# Patient Record
Sex: Female | Born: 1970 | Race: Black or African American | Hispanic: No | Smoking: Former smoker
Health system: Southern US, Community
[De-identification: ages and names within clinical notes are randomized; demographics above are authoritative.]

## PROBLEM LIST (undated history)

## (undated) ENCOUNTER — Inpatient Hospital Stay (HOSPITAL_COMMUNITY): Payer: Self-pay

## (undated) DIAGNOSIS — F329 Major depressive disorder, single episode, unspecified: Secondary | ICD-10-CM

## (undated) DIAGNOSIS — F32A Depression, unspecified: Secondary | ICD-10-CM

## (undated) DIAGNOSIS — F419 Anxiety disorder, unspecified: Secondary | ICD-10-CM

## (undated) DIAGNOSIS — J45909 Unspecified asthma, uncomplicated: Secondary | ICD-10-CM

## (undated) DIAGNOSIS — IMO0002 Reserved for concepts with insufficient information to code with codable children: Secondary | ICD-10-CM

## (undated) DIAGNOSIS — A599 Trichomoniasis, unspecified: Secondary | ICD-10-CM

## (undated) DIAGNOSIS — A549 Gonococcal infection, unspecified: Secondary | ICD-10-CM

## (undated) DIAGNOSIS — R51 Headache: Secondary | ICD-10-CM

## (undated) DIAGNOSIS — K8020 Calculus of gallbladder without cholecystitis without obstruction: Secondary | ICD-10-CM

## (undated) DIAGNOSIS — R87619 Unspecified abnormal cytological findings in specimens from cervix uteri: Secondary | ICD-10-CM

## (undated) HISTORY — PX: GALLBLADDER SURGERY: SHX652

## (undated) HISTORY — PX: GYNECOLOGIC CRYOSURGERY: SHX857

## (undated) HISTORY — DX: Anxiety disorder, unspecified: F41.9

## (undated) HISTORY — PX: EYE SURGERY: SHX253

---
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
## 1998-07-10 ENCOUNTER — Emergency Department (HOSPITAL_COMMUNITY): Admission: EM | Admit: 1998-07-10 | Discharge: 1998-07-10 | Payer: Self-pay | Admitting: Family Medicine

## 1998-07-12 ENCOUNTER — Emergency Department (HOSPITAL_COMMUNITY): Admission: EM | Admit: 1998-07-12 | Discharge: 1998-07-12 | Payer: Self-pay | Admitting: Internal Medicine

## 1999-10-13 ENCOUNTER — Emergency Department (HOSPITAL_COMMUNITY): Admission: EM | Admit: 1999-10-13 | Discharge: 1999-10-13 | Payer: Self-pay | Admitting: Emergency Medicine

## 2000-07-27 ENCOUNTER — Emergency Department (HOSPITAL_COMMUNITY): Admission: EM | Admit: 2000-07-27 | Discharge: 2000-07-27 | Payer: Self-pay | Admitting: Internal Medicine

## 2001-08-02 ENCOUNTER — Other Ambulatory Visit: Admission: RE | Admit: 2001-08-02 | Discharge: 2001-08-02 | Payer: Self-pay | Admitting: Family Medicine

## 2002-10-22 ENCOUNTER — Emergency Department (HOSPITAL_COMMUNITY): Admission: EM | Admit: 2002-10-22 | Discharge: 2002-10-23 | Payer: Self-pay | Admitting: Emergency Medicine

## 2003-05-14 ENCOUNTER — Emergency Department (HOSPITAL_COMMUNITY): Admission: EM | Admit: 2003-05-14 | Discharge: 2003-05-14 | Payer: Self-pay | Admitting: Emergency Medicine

## 2003-06-09 ENCOUNTER — Emergency Department (HOSPITAL_COMMUNITY): Admission: EM | Admit: 2003-06-09 | Discharge: 2003-06-09 | Payer: Self-pay | Admitting: Emergency Medicine

## 2003-09-01 ENCOUNTER — Emergency Department (HOSPITAL_COMMUNITY): Admission: EM | Admit: 2003-09-01 | Discharge: 2003-09-01 | Payer: Self-pay

## 2004-02-12 ENCOUNTER — Ambulatory Visit (HOSPITAL_COMMUNITY): Admission: RE | Admit: 2004-02-12 | Discharge: 2004-02-12 | Payer: Self-pay | Admitting: Family Medicine

## 2004-11-29 ENCOUNTER — Emergency Department (HOSPITAL_COMMUNITY): Admission: EM | Admit: 2004-11-29 | Discharge: 2004-11-29 | Payer: Self-pay | Admitting: Emergency Medicine

## 2005-04-04 ENCOUNTER — Emergency Department (HOSPITAL_COMMUNITY): Admission: EM | Admit: 2005-04-04 | Discharge: 2005-04-04 | Payer: Self-pay | Admitting: Emergency Medicine

## 2005-06-20 ENCOUNTER — Emergency Department (HOSPITAL_COMMUNITY): Admission: EM | Admit: 2005-06-20 | Discharge: 2005-06-20 | Payer: Self-pay | Admitting: Emergency Medicine

## 2005-11-20 ENCOUNTER — Emergency Department (HOSPITAL_COMMUNITY): Admission: EM | Admit: 2005-11-20 | Discharge: 2005-11-20 | Payer: Self-pay | Admitting: Emergency Medicine

## 2006-01-16 ENCOUNTER — Emergency Department (HOSPITAL_COMMUNITY): Admission: EM | Admit: 2006-01-16 | Discharge: 2006-01-16 | Payer: Self-pay | Admitting: Emergency Medicine

## 2007-02-13 ENCOUNTER — Emergency Department (HOSPITAL_COMMUNITY): Admission: EM | Admit: 2007-02-13 | Discharge: 2007-02-14 | Payer: Self-pay | Admitting: Emergency Medicine

## 2007-08-20 ENCOUNTER — Emergency Department (HOSPITAL_COMMUNITY): Admission: EM | Admit: 2007-08-20 | Discharge: 2007-08-20 | Payer: Self-pay | Admitting: Emergency Medicine

## 2009-10-12 ENCOUNTER — Emergency Department (HOSPITAL_COMMUNITY): Admission: EM | Admit: 2009-10-12 | Discharge: 2009-10-12 | Payer: Self-pay | Admitting: Emergency Medicine

## 2010-01-21 ENCOUNTER — Emergency Department (HOSPITAL_COMMUNITY): Admission: EM | Admit: 2010-01-21 | Discharge: 2010-01-21 | Payer: Self-pay | Admitting: Family Medicine

## 2010-02-15 ENCOUNTER — Emergency Department (HOSPITAL_COMMUNITY): Admission: EM | Admit: 2010-02-15 | Discharge: 2010-02-15 | Payer: Self-pay | Admitting: Emergency Medicine

## 2010-05-03 ENCOUNTER — Emergency Department (HOSPITAL_COMMUNITY): Admission: EM | Admit: 2010-05-03 | Discharge: 2010-05-03 | Payer: Self-pay | Admitting: Emergency Medicine

## 2010-07-21 ENCOUNTER — Emergency Department (HOSPITAL_COMMUNITY): Admission: EM | Admit: 2010-07-21 | Discharge: 2010-07-21 | Payer: Self-pay | Admitting: Emergency Medicine

## 2010-12-16 ENCOUNTER — Emergency Department (HOSPITAL_COMMUNITY)
Admission: EM | Admit: 2010-12-16 | Discharge: 2010-12-16 | Disposition: A | Discharge status: Home / Self Care | Payer: Self-pay | Attending: Emergency Medicine | Admitting: Emergency Medicine

## 2010-12-16 DIAGNOSIS — Y93G1 Activity, food preparation and clean up: Secondary | ICD-10-CM | POA: Insufficient documentation

## 2010-12-16 DIAGNOSIS — F319 Bipolar disorder, unspecified: Secondary | ICD-10-CM | POA: Insufficient documentation

## 2010-12-16 DIAGNOSIS — S61209A Unspecified open wound of unspecified finger without damage to nail, initial encounter: Secondary | ICD-10-CM | POA: Insufficient documentation

## 2010-12-16 DIAGNOSIS — W269XXA Contact with unspecified sharp object(s), initial encounter: Secondary | ICD-10-CM | POA: Insufficient documentation

## 2010-12-16 DIAGNOSIS — Y92009 Unspecified place in unspecified non-institutional (private) residence as the place of occurrence of the external cause: Secondary | ICD-10-CM | POA: Insufficient documentation

## 2011-01-15 LAB — RAPID URINE DRUG SCREEN, HOSP PERFORMED
Amphetamines: NOT DETECTED
Benzodiazepines: NOT DETECTED
Cocaine: NOT DETECTED
Tetrahydrocannabinol: NOT DETECTED

## 2011-03-21 ENCOUNTER — Emergency Department (HOSPITAL_COMMUNITY)
Admission: EM | Admit: 2011-03-21 | Discharge: 2011-03-21 | Disposition: A | Discharge status: Home / Self Care | Payer: Self-pay | Attending: Emergency Medicine | Admitting: Emergency Medicine

## 2011-03-21 ENCOUNTER — Emergency Department (HOSPITAL_COMMUNITY): Payer: Self-pay

## 2011-03-21 DIAGNOSIS — K8020 Calculus of gallbladder without cholecystitis without obstruction: Secondary | ICD-10-CM | POA: Insufficient documentation

## 2011-03-21 DIAGNOSIS — F319 Bipolar disorder, unspecified: Secondary | ICD-10-CM | POA: Insufficient documentation

## 2011-03-21 DIAGNOSIS — R112 Nausea with vomiting, unspecified: Secondary | ICD-10-CM | POA: Insufficient documentation

## 2011-03-21 DIAGNOSIS — R1011 Right upper quadrant pain: Secondary | ICD-10-CM | POA: Insufficient documentation

## 2011-03-21 DIAGNOSIS — R079 Chest pain, unspecified: Secondary | ICD-10-CM | POA: Insufficient documentation

## 2011-03-21 LAB — COMPREHENSIVE METABOLIC PANEL
Albumin: 3.7 g/dL (ref 3.5–5.2)
BUN: 10 mg/dL (ref 6–23)
Calcium: 9.1 mg/dL (ref 8.4–10.5)
Glucose, Bld: 104 mg/dL — ABNORMAL HIGH (ref 70–99)
Potassium: 4.7 mEq/L (ref 3.5–5.1)
Sodium: 135 mEq/L (ref 135–145)
Total Bilirubin: 0.7 mg/dL (ref 0.3–1.2)

## 2011-03-21 LAB — DIFFERENTIAL
Eosinophils Absolute: 0.1 10*3/uL (ref 0.0–0.7)
Eosinophils Relative: 1 % (ref 0–5)
Lymphs Abs: 1.1 10*3/uL (ref 0.7–4.0)
Monocytes Absolute: 0.5 10*3/uL (ref 0.1–1.0)
Monocytes Relative: 7 % (ref 3–12)

## 2011-03-21 LAB — CBC
Hemoglobin: 12.6 g/dL (ref 12.0–15.0)
MCH: 32 pg (ref 26.0–34.0)
MCHC: 33.6 g/dL (ref 30.0–36.0)
Platelets: 268 10*3/uL (ref 150–400)
RDW: 13.4 % (ref 11.5–15.5)

## 2011-03-21 LAB — URINALYSIS, ROUTINE W REFLEX MICROSCOPIC: Nitrite: NEGATIVE

## 2011-06-05 ENCOUNTER — Other Ambulatory Visit: Payer: Self-pay | Admitting: Family Medicine

## 2011-06-05 DIAGNOSIS — Z3689 Encounter for other specified antenatal screening: Secondary | ICD-10-CM

## 2011-06-05 LAB — RPR: RPR: NONREACTIVE

## 2011-06-07 LAB — STREP B DNA PROBE: GBS: POSITIVE

## 2011-06-11 LAB — ABO/RH: RH Type: POSITIVE

## 2011-06-11 LAB — ANTIBODY SCREEN: Antibody Screen: NEGATIVE

## 2011-06-19 DIAGNOSIS — N949 Unspecified condition associated with female genital organs and menstrual cycle: Secondary | ICD-10-CM

## 2011-06-26 ENCOUNTER — Other Ambulatory Visit: Payer: Self-pay | Admitting: Family Medicine

## 2011-06-26 DIAGNOSIS — Z3689 Encounter for other specified antenatal screening: Secondary | ICD-10-CM

## 2011-06-27 ENCOUNTER — Encounter (HOSPITAL_COMMUNITY): Payer: Self-pay

## 2011-06-27 ENCOUNTER — Other Ambulatory Visit: Payer: Self-pay | Admitting: Family Medicine

## 2011-06-27 ENCOUNTER — Ambulatory Visit (HOSPITAL_COMMUNITY)
Admission: RE | Admit: 2011-06-27 | Discharge: 2011-06-27 | Disposition: A | Discharge status: Home / Self Care | Payer: Medicaid Other | Source: Ambulatory Visit | Attending: Family Medicine | Admitting: Family Medicine

## 2011-06-27 ENCOUNTER — Ambulatory Visit (HOSPITAL_COMMUNITY): Admission: RE | Admit: 2011-06-27 | Payer: Medicaid Other | Source: Ambulatory Visit

## 2011-06-27 DIAGNOSIS — Z3682 Encounter for antenatal screening for nuchal translucency: Secondary | ICD-10-CM

## 2011-06-27 DIAGNOSIS — O09219 Supervision of pregnancy with history of pre-term labor, unspecified trimester: Secondary | ICD-10-CM | POA: Insufficient documentation

## 2011-06-27 DIAGNOSIS — O3510X0 Maternal care for (suspected) chromosomal abnormality in fetus, unspecified, not applicable or unspecified: Secondary | ICD-10-CM | POA: Insufficient documentation

## 2011-06-27 DIAGNOSIS — Z3689 Encounter for other specified antenatal screening: Secondary | ICD-10-CM | POA: Insufficient documentation

## 2011-06-27 DIAGNOSIS — O351XX0 Maternal care for (suspected) chromosomal abnormality in fetus, not applicable or unspecified: Secondary | ICD-10-CM | POA: Insufficient documentation

## 2011-06-27 DIAGNOSIS — O09529 Supervision of elderly multigravida, unspecified trimester: Secondary | ICD-10-CM | POA: Insufficient documentation

## 2011-06-27 NOTE — Progress Notes (Signed)
Patient seen for ultrasound only appointment today.  Please see [facility] report for details.  

## 2011-06-29 ENCOUNTER — Ambulatory Visit (HOSPITAL_COMMUNITY)
Admission: RE | Admit: 2011-06-29 | Discharge: 2011-06-29 | Disposition: A | Discharge status: Home / Self Care | Payer: Medicaid Other | Source: Ambulatory Visit | Attending: Family Medicine | Admitting: Family Medicine

## 2011-06-29 DIAGNOSIS — O09219 Supervision of pregnancy with history of pre-term labor, unspecified trimester: Secondary | ICD-10-CM | POA: Insufficient documentation

## 2011-06-29 DIAGNOSIS — O351XX0 Maternal care for (suspected) chromosomal abnormality in fetus, not applicable or unspecified: Secondary | ICD-10-CM | POA: Insufficient documentation

## 2011-06-29 DIAGNOSIS — O3510X0 Maternal care for (suspected) chromosomal abnormality in fetus, unspecified, not applicable or unspecified: Secondary | ICD-10-CM | POA: Insufficient documentation

## 2011-06-29 DIAGNOSIS — O09529 Supervision of elderly multigravida, unspecified trimester: Secondary | ICD-10-CM | POA: Insufficient documentation

## 2011-06-29 DIAGNOSIS — Z3682 Encounter for antenatal screening for nuchal translucency: Secondary | ICD-10-CM

## 2011-06-29 NOTE — Progress Notes (Signed)
Vital signs reviewed; see ultrasound report 

## 2011-06-29 NOTE — ED Notes (Signed)
Patient denies any problems today. 

## 2011-06-30 ENCOUNTER — Other Ambulatory Visit (HOSPITAL_COMMUNITY): Payer: Medicaid Other

## 2011-07-04 ENCOUNTER — Telehealth (HOSPITAL_COMMUNITY): Payer: Self-pay | Admitting: MS"

## 2011-07-04 NOTE — Telephone Encounter (Signed)
Left message for patient at home number and mobile number to return call regarding results.

## 2011-07-06 ENCOUNTER — Telehealth (HOSPITAL_COMMUNITY): Payer: Self-pay | Admitting: MS"

## 2011-07-06 NOTE — Telephone Encounter (Signed)
Left message for patient that results of screen are available. Asked her to return call. Also left message at same number and home number on 07/04/11.

## 2011-07-14 ENCOUNTER — Other Ambulatory Visit: Payer: Self-pay | Admitting: Maternal and Fetal Medicine

## 2011-07-19 ENCOUNTER — Other Ambulatory Visit: Payer: Self-pay

## 2011-07-20 ENCOUNTER — Other Ambulatory Visit: Payer: Self-pay | Admitting: Family Medicine

## 2011-07-20 DIAGNOSIS — Z3689 Encounter for other specified antenatal screening: Secondary | ICD-10-CM

## 2011-08-01 ENCOUNTER — Ambulatory Visit (HOSPITAL_COMMUNITY)
Admission: RE | Admit: 2011-08-01 | Discharge: 2011-08-01 | Disposition: A | Discharge status: Home / Self Care | Payer: Medicaid Other | Source: Ambulatory Visit | Attending: Family Medicine | Admitting: Family Medicine

## 2011-08-01 ENCOUNTER — Ambulatory Visit (HOSPITAL_COMMUNITY): Payer: Medicaid Other

## 2011-08-01 VITALS — BP 107/65 | HR 87 | Wt 137.0 lb

## 2011-08-01 DIAGNOSIS — O358XX0 Maternal care for other (suspected) fetal abnormality and damage, not applicable or unspecified: Secondary | ICD-10-CM | POA: Insufficient documentation

## 2011-08-01 DIAGNOSIS — Z3689 Encounter for other specified antenatal screening: Secondary | ICD-10-CM

## 2011-08-01 DIAGNOSIS — O09529 Supervision of elderly multigravida, unspecified trimester: Secondary | ICD-10-CM | POA: Insufficient documentation

## 2011-08-01 DIAGNOSIS — O09219 Supervision of pregnancy with history of pre-term labor, unspecified trimester: Secondary | ICD-10-CM | POA: Insufficient documentation

## 2011-08-01 DIAGNOSIS — Z1389 Encounter for screening for other disorder: Secondary | ICD-10-CM | POA: Insufficient documentation

## 2011-08-01 DIAGNOSIS — Z363 Encounter for antenatal screening for malformations: Secondary | ICD-10-CM | POA: Insufficient documentation

## 2011-08-01 NOTE — Progress Notes (Signed)
Ultrasound in [facility]/EP[facility].  Follow up U/S scheduled 

## 2011-08-08 ENCOUNTER — Inpatient Hospital Stay (HOSPITAL_COMMUNITY)
Admission: AD | Admit: 2011-08-08 | Discharge: 2011-08-08 | Disposition: A | Discharge status: Home / Self Care | Payer: Medicaid Other | Source: Ambulatory Visit | Attending: Obstetrics & Gynecology | Admitting: Obstetrics & Gynecology

## 2011-08-08 ENCOUNTER — Encounter (HOSPITAL_COMMUNITY): Payer: Self-pay | Admitting: *Deleted

## 2011-08-08 DIAGNOSIS — O99891 Other specified diseases and conditions complicating pregnancy: Secondary | ICD-10-CM | POA: Insufficient documentation

## 2011-08-08 DIAGNOSIS — R109 Unspecified abdominal pain: Secondary | ICD-10-CM | POA: Insufficient documentation

## 2011-08-08 DIAGNOSIS — N949 Unspecified condition associated with female genital organs and menstrual cycle: Secondary | ICD-10-CM

## 2011-08-08 HISTORY — DX: Headache: R51

## 2011-08-08 LAB — URINALYSIS, ROUTINE W REFLEX MICROSCOPIC
Bilirubin Urine: NEGATIVE
Ketones, ur: NEGATIVE mg/dL
Leukocytes, UA: NEGATIVE
Nitrite: NEGATIVE
Protein, ur: NEGATIVE mg/dL
Urobilinogen, UA: 0.2 mg/dL (ref 0.0–1.0)
pH: 8 (ref 5.0–8.0)

## 2011-08-08 NOTE — ED Notes (Signed)
Care at [facility]  PTL with other 2 preg, delivered at 39wks with both.

## 2011-08-08 NOTE — Progress Notes (Signed)
Pt states abd pain & pressure started on Sunday, states it feel like "baby is pinching me in there."

## 2011-08-08 NOTE — Progress Notes (Signed)
abd pain, pressure lower abd. Hard to walk.

## 2011-08-08 NOTE — ED Provider Notes (Signed)
History     Chief Complaint  Patient presents with  . Abdominal Pain   HPI 40 yo G3 P2 at 19.[redacted] weeks gestation presents with c/o pelvic pressure radiating to back since this weekend. States got better then worsened. Does report frequency but no dysuria. Denies bleeding or discharge. No N/V/D. Gets care at [facility] and has not been there in 2 weeks. Denies problems with this pregnancy. States has history of PTL with both preg. But delivered at term   Past Medical History  Diagnosis Date  . Headache     Past Surgical History  Procedure Date  . Eye surgery   . Gynecologic cryosurgery     No family history on file.  History  Substance Use Topics  . Smoking status: Never Smoker   . Smokeless tobacco: Not on file  . Alcohol Use: No    Allergies: No Known Allergies  Prescriptions prior to admission  Medication Sig Dispense Refill  . amoxicillin (AMOXIL) 500 MG capsule Take 500 mg by mouth.        . hydrOXYzine (VISTARIL) 25 MG capsule Take 25 mg by mouth 3 (three) times daily as needed. For headache       . prenatal vitamin w/FE, FA (PRENATAL 1 + 1) 27-1 MG TABS Take 1 tablet by mouth daily.          Review of Systems  Constitutional: Negative for fever.  Gastrointestinal: Positive for abdominal pain (cramping over lower abdomen). Negative for nausea and vomiting.  Genitourinary: Positive for frequency. Negative for dysuria.   Physical Exam   Blood pressure 113/67, pulse 77, temperature 99 F (37.2 C), temperature source Oral, resp. rate 20, height 5\' 3"  (1.6 m), weight 139 lb (63.05 kg), last menstrual period 03/27/2011.  Physical Exam  Constitutional: She is oriented to person, place, and time. She appears well-developed and well-nourished.  HENT:  Head: Normocephalic.  Respiratory: Effort normal.  GI: Soft. She exhibits no distension. There is tenderness (Slightly tender over lower pelvis). There is no rebound and no guarding.  Genitourinary: Vagina normal and uterus  normal. No vaginal discharge found.       Size equal dates.  Cervix long and closed.  Neurological: She is alert and oriented to person, place, and time.  Skin: Skin is warm and dry.  Psychiatric: She has a normal mood and affect.  FHR 160. No palpable contractions. Uterus soft. UA negative  MAU Course  Procedures   Assessment and Plan  A:  Probably Round ligament pain No evidence of preterm labor or UTI  P:  D/C home with comfort measures.  Pregnancy support belt Followup as scheduled with [facility].  Wynelle Bourgeois 08/08/2011, 12:45 PM

## 2011-08-08 NOTE — ED Provider Notes (Signed)
Chart reviewed and agree with management and plan.  

## 2011-08-20 ENCOUNTER — Inpatient Hospital Stay (HOSPITAL_COMMUNITY)
Admission: AD | Admit: 2011-08-20 | Discharge: 2011-08-20 | Disposition: A | Discharge status: Home / Self Care | Payer: Medicaid Other | Source: Ambulatory Visit | Attending: Obstetrics and Gynecology | Admitting: Obstetrics and Gynecology

## 2011-08-20 ENCOUNTER — Encounter (HOSPITAL_COMMUNITY): Payer: Self-pay | Admitting: *Deleted

## 2011-08-20 DIAGNOSIS — R112 Nausea with vomiting, unspecified: Secondary | ICD-10-CM

## 2011-08-20 DIAGNOSIS — O210 Mild hyperemesis gravidarum: Secondary | ICD-10-CM | POA: Insufficient documentation

## 2011-08-20 DIAGNOSIS — O99891 Other specified diseases and conditions complicating pregnancy: Secondary | ICD-10-CM | POA: Insufficient documentation

## 2011-08-20 DIAGNOSIS — K529 Noninfective gastroenteritis and colitis, unspecified: Secondary | ICD-10-CM

## 2011-08-20 DIAGNOSIS — R109 Unspecified abdominal pain: Secondary | ICD-10-CM | POA: Insufficient documentation

## 2011-08-20 HISTORY — DX: Unspecified abnormal cytological findings in specimens from cervix uteri: R87.619

## 2011-08-20 HISTORY — DX: Reserved for concepts with insufficient information to code with codable children: IMO0002

## 2011-08-20 HISTORY — DX: Gonococcal infection, unspecified: A54.9

## 2011-08-20 LAB — URINALYSIS, ROUTINE W REFLEX MICROSCOPIC
Bilirubin Urine: NEGATIVE
Hgb urine dipstick: NEGATIVE
Ketones, ur: 15 mg/dL — AB
Nitrite: NEGATIVE
Protein, ur: NEGATIVE mg/dL
Specific Gravity, Urine: >1.03 — ABNORMAL HIGH (ref 1.005–1.030)
Urobilinogen, UA: 0.2 mg/dL (ref 0.0–1.0)

## 2011-08-20 LAB — WET PREP, GENITAL: Trich, Wet Prep: NONE SEEN

## 2011-08-20 MED ORDER — ONDANSETRON HCL 4 MG/2ML IJ SOLN: 4.0000 mg | Freq: Once | INTRAMUSCULAR | Status: DC

## 2011-08-20 MED ORDER — ONDANSETRON 8 MG PO TBDP: 8.0000 mg | ORAL_TABLET | Freq: Three times a day (TID) | ORAL | Status: AC | PRN

## 2011-08-20 MED ORDER — ONDANSETRON 8 MG PO TBDP
8.0000 mg | ORAL_TABLET | Freq: Once | ORAL | Status: AC
  Administered 2011-08-20: 8 mg via ORAL
  Filled 2011-08-20: qty 1

## 2011-08-20 NOTE — ED Provider Notes (Signed)
History     Chief Complaint  Patient presents with  . Leg Swelling    right swelling   HPI 40 yo G3P2002 at 20.6 wks presenting with 2 week history of lower abdominal pain and nausea with vomiting.  Able to tolerate po food and liquids, but does report about 1-2 episodes vomiting a day.  Lower abdominal pain described as dull cramping bilateral lower quadrants.  No dysuria, frequency, hesitancy.  Good fetal movement.  Does report some vaginal discharge for past week or so.  No vaginal bleeding.  Also of note, she reports some ankle swelling for about a month, denies any trauma.   OB History    Grav Para Term Preterm Abortions TAB SAB Ect Mult Living   3 2 2  0 0 0 0 0 0 2      Past Medical History  Diagnosis Date  . Headache   . Abnormal Pap smear   . Gonorrhea     Past Surgical History  Procedure Date  . Eye surgery   . Gynecologic cryosurgery     No family history on file.  History  Substance Use Topics  . Smoking status: Former Games developer  . Smokeless tobacco: Never Used  . Alcohol Use: No    Allergies: No Known Allergies  Prescriptions prior to admission  Medication Sig Dispense Refill  . amoxicillin (AMOXIL) 500 MG capsule Take 500 mg by mouth.        . hydrOXYzine (VISTARIL) 25 MG capsule Take 25 mg by mouth 3 (three) times daily as needed. For headache       . prenatal vitamin w/FE, FA (PRENATAL 1 + 1) 27-1 MG TABS Take 1 tablet by mouth daily.          Review of Systems  Constitutional: Negative for fever and chills.  Eyes: Negative for blurred vision and double vision.  Cardiovascular: Negative for palpitations.  Gastrointestinal: Negative for heartburn, nausea, vomiting and abdominal pain.  Genitourinary: Negative for dysuria and urgency.  Musculoskeletal: Negative for myalgias.  Skin: Negative for itching and rash.  Neurological: Negative for dizziness and headaches.  Endo/Heme/Allergies: Does not bruise/bleed easily.  Psychiatric/Behavioral: Negative  for depression.   Physical Exam   Blood pressure 119/53, pulse 99, temperature 98.5 F (36.9 C), temperature source Oral, resp. rate 18, height 5\' 3"  (1.6 m), weight 146 lb 3.2 oz (66.316 kg), last menstrual period 03/27/2011.  Physical Exam  Constitutional: She is oriented to person, place, and time. She appears well-developed and well-nourished.  HENT:  Head: Normocephalic and atraumatic.  Eyes: Conjunctivae and EOM are normal. Pupils are equal, round, and reactive to light.  Neck: Normal range of motion. Neck supple.  Cardiovascular: Normal rate, regular rhythm and normal heart sounds.   Respiratory: Effort normal and breath sounds normal.  GI: Soft. Bowel sounds are normal.       Gravid, fundal heights consistent with dates.   Genitourinary: Vagina normal and uterus normal.  Musculoskeletal: Normal range of motion.       Left ankle WNL Right ankle with trace edema.  No discoloration.  Good active and passive range of motion without pain.  Anterior ankle drawer sign negative.  No ligamentous laxity.  No tenderness to palpation, no tenderness with joint testing.  Right calf without redness, erythema, warmth, pain.  Homan's negative.   Neurological: She is alert and oriented to person, place, and time. She has normal reflexes.  Skin: Skin is warm and dry.  Psychiatric: She has a normal  mood and affect. Her behavior is normal.    MAU Course  Procedures  MDM Sterile speculum exam revealed no pooling, some thick white discharge noted.   SVE:  Closed/thick/posterior cervix   Assessment and Plan  40 yo Female with lower abdominal quadrant pain, nausea and vomiting: - likely Round ligament pain.  Tylenol for relief - UA revealed dehydration.  Plan to treat with Zofran and oral rehydration here in MAU - wet prep obtained.   - Ankle swelling:  Atraumatic without ankle pain.  No leg swelling, pain, redness, warmth.  Not a DVT.  Follow clinically as outpatient.      WALDEN,JEFF 08/20/2011, 8:00 PM   Seen and agree.   Has been laying on Right side primarily, probably resulting in edema Will give zofran then d/c home Wet prep negative.

## 2011-08-20 NOTE — Progress Notes (Signed)
Pt reports right foot/ankle swollen x 1 week. Pt also reports pain in lower right abd when getting up from a sitting position

## 2011-08-21 NOTE — ED Provider Notes (Signed)
Agree with above note.  [name] Kirby 08/21/2011 7:35 AM

## 2011-08-23 ENCOUNTER — Ambulatory Visit (HOSPITAL_COMMUNITY)
Admission: RE | Admit: 2011-08-23 | Discharge: 2011-08-23 | Disposition: A | Discharge status: Home / Self Care | Payer: Medicaid Other | Source: Ambulatory Visit | Attending: Family Medicine | Admitting: Family Medicine

## 2011-08-23 DIAGNOSIS — O09529 Supervision of elderly multigravida, unspecified trimester: Secondary | ICD-10-CM | POA: Insufficient documentation

## 2011-08-23 DIAGNOSIS — Z3689 Encounter for other specified antenatal screening: Secondary | ICD-10-CM

## 2011-08-23 DIAGNOSIS — O09219 Supervision of pregnancy with history of pre-term labor, unspecified trimester: Secondary | ICD-10-CM | POA: Insufficient documentation

## 2011-09-28 ENCOUNTER — Encounter (HOSPITAL_COMMUNITY): Payer: Self-pay | Admitting: *Deleted

## 2011-09-28 ENCOUNTER — Inpatient Hospital Stay (HOSPITAL_COMMUNITY)
Admission: AD | Admit: 2011-09-28 | Discharge: 2011-09-28 | Disposition: A | Discharge status: Home / Self Care | Payer: Medicaid Other | Source: Ambulatory Visit | Attending: Obstetrics & Gynecology | Admitting: Obstetrics & Gynecology

## 2011-09-28 DIAGNOSIS — R109 Unspecified abdominal pain: Secondary | ICD-10-CM | POA: Insufficient documentation

## 2011-09-28 DIAGNOSIS — Z3490 Encounter for supervision of normal pregnancy, unspecified, unspecified trimester: Secondary | ICD-10-CM

## 2011-09-28 DIAGNOSIS — O4700 False labor before 37 completed weeks of gestation, unspecified trimester: Secondary | ICD-10-CM | POA: Insufficient documentation

## 2011-09-28 HISTORY — DX: Trichomoniasis, unspecified: A59.9

## 2011-09-28 LAB — WET PREP, GENITAL
Clue Cells Wet Prep HPF POC: NONE SEEN
Trich, Wet Prep: NONE SEEN
Yeast Wet Prep HPF POC: NONE SEEN

## 2011-09-28 LAB — URINALYSIS, ROUTINE W REFLEX MICROSCOPIC
Glucose, UA: NEGATIVE mg/dL
Hgb urine dipstick: NEGATIVE
Leukocytes, UA: NEGATIVE
Protein, ur: NEGATIVE mg/dL
Specific Gravity, Urine: 1.02 (ref 1.005–1.030)

## 2011-09-28 LAB — HEPATITIS B SURFACE ANTIGEN: Hepatitis B Surface Ag: NEGATIVE

## 2011-09-28 LAB — GC/CHLAMYDIA PROBE AMP, GENITAL
Gonorrhea: NEGATIVE
Gonorrhea: NEGATIVE

## 2011-09-28 NOTE — Progress Notes (Signed)
WAS IN OFFICE 2 WEEKS AGO- TOLD HIM SHE WAS HURTING.  SAID NOTHING

## 2011-09-28 NOTE — Progress Notes (Signed)
C/o pain with active FM, also feeling "pressure" and has a "short cervix."

## 2011-09-28 NOTE — Progress Notes (Signed)
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  HAS HARD TIME SLEEPING  ON BACK -  UNSURE IF HAVING CRAMPS THAT COME AND  GO.  WHEN BABY KICKS IT HURTS.

## 2011-09-28 NOTE — ED Provider Notes (Addendum)
History     No chief complaint on file.  HPI [name] Kirby 40 y.o. female  G3P2002 at [redacted]w[redacted]d presenting with lower abdominal pain.   Patient reports having bilateral lower quadrant pain for the last few days. She says she has pains that last about 15 minutes every hour or two. She says she had this pain about a month ago. Chart review shows suspected round ligament pain. Patient has not had sex within the last few weeks. 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   Patient denies decreased fetal movement/abnormal discharge/blood from vagina/rush of fluid.     OB History    Grav Para Term Preterm Abortions TAB SAB Ect Mult Living   3 2 2  0 0 0 0 0 0 2      Past Medical History  Diagnosis Date  . Headache   . Abnormal Pap smear   . Gonorrhea   . Trichomonas     Past Surgical History  Procedure Date  . Eye surgery   . Gynecologic cryosurgery     History reviewed. No pertinent family history.  History  Substance Use Topics  . Smoking status: Former Games developer  . Smokeless tobacco: Never Used  . Alcohol Use: No    Allergies: No Known Allergies  Prescriptions prior to admission  Medication Sig Dispense Refill  . hydrOXYzine (VISTARIL) 25 MG capsule Take 25 mg by mouth 3 (three) times daily as needed. For headache       . ondansetron (ZOFRAN-ODT) 8 MG disintegrating tablet Take 8 mg by mouth every 8 (eight) hours as needed. For nausea, patient just picked up medication on 09-27-11       . prenatal vitamin w/FE, FA (PRENATAL 1 + 1) 27-1 MG TABS Take 1 tablet by mouth daily.          ROS Physical Exam   Blood pressure 102/68, pulse 77, temperature 98.3 F (36.8 C), temperature source Oral, resp. rate 18, height 5\' 1"  (1.549 m), weight 68.55 kg (151 lb 2 oz), last menstrual period 03/27/2011.  Physical Exam  Constitutional: She is oriented to person, place, and time. She appears well-developed and  well-nourished. No distress.  Cardiovascular: Normal rate and regular rhythm.  Exam reveals no gallop and no friction rub.   No murmur heard. Respiratory: Breath sounds normal. No respiratory distress. She has no wheezes. She has no rales.  GI:       Gravid. Size consistent with dates.   Genitourinary: Vagina normal and uterus normal.  Musculoskeletal: She exhibits edema.  Neurological: She is alert and oriented to person, place, and time.  edema 1+   Cervical exam-closed/thick/high  FHT-irregular contractions 5-10 minutes. 135 baseline. Accels present. Decels absent. Moderate variability  MAU Course  Procedures  MDM Will get wet prep, UA-unremarkable pending GC/Chlamydia from urine.   Assessment and Plan  #1 G3P2002 at [redacted]w[redacted]d #2 Round Ligament Pain-consistent with reported pain from home #3 False Labor-noticed on monitor with irregular contractions  Discharge home. Labor precautions. Tylenol for round ligament pain.  Pending GC/chlamydia but will not hold patient for results.   Case discussed with [name], MD  Tana Conch 09/28/2011, 9:21 PM

## 2011-09-30 LAB — GC/CHLAMYDIA PROBE AMP, URINE
Chlamydia, Swab/Urine, PCR: NEGATIVE
GC Probe Amp, Urine: NEGATIVE

## 2011-11-05 ENCOUNTER — Encounter (HOSPITAL_COMMUNITY): Payer: Self-pay

## 2011-11-05 ENCOUNTER — Inpatient Hospital Stay (HOSPITAL_COMMUNITY)
Admission: AD | Admit: 2011-11-05 | Discharge: 2011-11-05 | Disposition: A | Discharge status: Home / Self Care | Payer: Medicaid Other | Source: Ambulatory Visit | Attending: Obstetrics & Gynecology | Admitting: Obstetrics & Gynecology

## 2011-11-05 DIAGNOSIS — R109 Unspecified abdominal pain: Secondary | ICD-10-CM | POA: Insufficient documentation

## 2011-11-05 DIAGNOSIS — N949 Unspecified condition associated with female genital organs and menstrual cycle: Secondary | ICD-10-CM | POA: Insufficient documentation

## 2011-11-05 DIAGNOSIS — O99891 Other specified diseases and conditions complicating pregnancy: Secondary | ICD-10-CM | POA: Insufficient documentation

## 2011-11-05 HISTORY — DX: Depression, unspecified: F32.A

## 2011-11-05 HISTORY — DX: Major depressive disorder, single episode, unspecified: F32.9

## 2011-11-05 LAB — URINALYSIS, ROUTINE W REFLEX MICROSCOPIC
Bilirubin Urine: NEGATIVE
Glucose, UA: NEGATIVE mg/dL
Hgb urine dipstick: NEGATIVE
Protein, ur: NEGATIVE mg/dL
Specific Gravity, Urine: 1.02 (ref 1.005–1.030)

## 2011-11-05 NOTE — ED Provider Notes (Signed)
History   41 y/o F G3P2002 with 31.6 weeks presenting with right sided abdominal pain that started a month ago. This pain is intermittent, intensity 4/10, and is sharp. It is described as related with certain positions. It is located on Right lower pelvic area that irradiates to R inguinal. At the time of examining her she denied pain and declined pharmacologic intervention in the MAU. She denies nausea, vomiting, fever or other symptoms. She has Hx of preterm labor with her other pregnancies.   Chief Complaint  Patient presents with  . Pelvic Pain   HPI  OB History    Grav Para Term Preterm Abortions TAB SAB Ect Mult Living   3 2 2  0 0 0 0 0 0 2      Past Medical History  Diagnosis Date  . Headache   . Abnormal Pap smear   . Gonorrhea   . Trichomonas   . Depression     bipolar    Past Surgical History  Procedure Date  . Eye surgery   . Gynecologic cryosurgery   . Eye surgery     Family History  Problem Relation Age of Onset  . Anesthesia problems Neg Hx   . Hypotension Neg Hx   . Pseudochol deficiency Neg Hx   . Malignant hyperthermia Neg Hx     History  Substance Use Topics  . Smoking status: Former Games developer  . Smokeless tobacco: Never Used  . Alcohol Use: No    Allergies: No Known Allergies  Prescriptions prior to admission  Medication Sig Dispense Refill  . acetaminophen (TYLENOL) 325 MG tablet Take 650 mg by mouth every 6 (six) hours as needed. For pain       . hydrOXYzine (VISTARIL) 25 MG capsule Take 25 mg by mouth 3 (three) times daily as needed. For headache       . ondansetron (ZOFRAN-ODT) 8 MG disintegrating tablet Take 8 mg by mouth every 8 (eight) hours as needed. For nausea, patient just picked up medication on 09-27-11       . prenatal vitamin w/FE, FA (PRENATAL 1 + 1) 27-1 MG TABS Take 1 tablet by mouth daily.        . pseudoephedrine (SUDAFED) 30 MG tablet Take 30 mg by mouth every 6 (six) hours as needed. For congestion         ROS Per  HPI Physical Exam   Blood pressure 125/65, pulse 86, temperature 98.1 F (36.7 C), temperature source Oral, resp. rate 16, height 5\' 3"  (1.6 m), weight 70.67 kg (155 lb 12.8 oz), last menstrual period 03/27/2011.  Physical Exam  Constitutional: She is oriented to person, place, and time. She appears well-developed. No distress.  HENT:  Mouth/Throat: Oropharynx is clear and moist.  Eyes: Conjunctivae are normal.  Neck: Neck supple.  Cardiovascular: Normal rate and regular rhythm.   No murmur heard. Respiratory: Effort normal and breath sounds normal. No respiratory distress.  GI: Soft. Bowel sounds are normal.       Appropriate abdominal exam of a 31 weeks pregnancy. No tenderness to palpation, no rebound tenderness, no guarding.    Genitourinary: No vaginal discharge found.       Cystocele present. Cervix posterior and closed. No vaginal discharge or bleeding.  Musculoskeletal: She exhibits no edema.  Neurological: She is alert and oriented to person, place, and time. She has normal reflexes.  Skin: No rash noted.    MAU Course  Procedures  MDM Urinalysis and micro: negative results. Fetal-Maternal  monitoring for 30 min. Category 1 . Isolated contractions.  Assessment and Plan   41 y/o F G3P2002 with 31.6 weeks presenting with pelvic pain. No cervical changes, isolated contractions (irregular) and reassuring Fetal monitoring tracings. No dysuria and negative urinalysis. On a pt with hx of preterm labor. Most likely at this time Round ligament pain. Assessment: 1. Discussed red flags about preterm labor signs. Pt agreeable to get attention right away if needed. 2. Rest and increase activity slowly. 3 Tylenol  for pain. 4. F/u with her prenatal care team.   D. [place] Sherron Flemings Paz. MD PGY-1  11/05/2011, 12:38 PM

## 2011-11-05 NOTE — Progress Notes (Signed)
Patient states constant pelvic pain since December. Pain is worse when she walk and when she tries to stand up. Denies vaginal bleeding. Reports positive fetal movement. Feels occasional contractions that aren't painful.

## 2011-11-05 NOTE — Progress Notes (Signed)
Has been having having pelvic pain can't lay down and when sitting down can't get up, feels baby is very low into pelvis, no burning with urination, G3P2 31 weeks

## 2011-12-03 ENCOUNTER — Encounter (HOSPITAL_COMMUNITY): Payer: Self-pay | Admitting: *Deleted

## 2011-12-03 ENCOUNTER — Inpatient Hospital Stay (HOSPITAL_COMMUNITY)
Admission: AD | Admit: 2011-12-03 | Discharge: 2011-12-03 | Disposition: A | Discharge status: Home / Self Care | Payer: Medicaid Other | Source: Ambulatory Visit | Attending: Obstetrics & Gynecology | Admitting: Obstetrics & Gynecology

## 2011-12-03 DIAGNOSIS — O4700 False labor before 37 completed weeks of gestation, unspecified trimester: Secondary | ICD-10-CM | POA: Insufficient documentation

## 2011-12-03 DIAGNOSIS — B373 Candidiasis of vulva and vagina: Secondary | ICD-10-CM

## 2011-12-03 DIAGNOSIS — B3731 Acute candidiasis of vulva and vagina: Secondary | ICD-10-CM | POA: Insufficient documentation

## 2011-12-03 DIAGNOSIS — O2390 Unspecified genitourinary tract infection in pregnancy, unspecified trimester: Secondary | ICD-10-CM | POA: Insufficient documentation

## 2011-12-03 DIAGNOSIS — R109 Unspecified abdominal pain: Secondary | ICD-10-CM | POA: Insufficient documentation

## 2011-12-03 LAB — WET PREP, GENITAL: Trich, Wet Prep: NONE SEEN

## 2011-12-03 MED ORDER — MICONAZOLE NITRATE 2 % VA CREA: 1.0000 | TOPICAL_CREAM | Freq: Every day | VAGINAL | Status: AC

## 2011-12-03 NOTE — ED Provider Notes (Signed)
History     Chief Complaint  Patient presents with  . Labor Eval   HPI 41 y/o F [id number] with 35.6 w that comes today complaining of vaginal discharge. She mentions that yesterday afternoon she felt watery "urine-like" running down her vagina, she was concerned that her water could have broken at that time. 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 To [place] she metioned abdominal pain on Right inguinal area that is constant and at one point yesterday was so intense that she had to stop walking. It was relieved by itself with no pharmacologic intervention. The pain is reproducible  with certain positions. No pain at this moment. No vaginal bleeding. Denies cephalea, vision changes, epigastric pain or edema.   OB History    Grav Para Term Preterm Abortions TAB SAB Ect Mult Living   3 2 2  0 0 0 0 0 0 2      Past Medical History  Diagnosis Date  . Headache   . Abnormal Pap smear   . Gonorrhea   . Trichomonas   . Depression     bipolar    Past Surgical History  Procedure Date  . Eye surgery   . Gynecologic cryosurgery   . Eye surgery     Family History  Problem Relation Age of Onset  . Anesthesia problems Neg Hx   . Hypotension Neg Hx   . Pseudochol deficiency Neg Hx   . Malignant hyperthermia Neg Hx   . Diabetes Mother   . Diabetes Maternal Grandmother   . Kidney disease Maternal Grandmother     History  Substance Use Topics  . Smoking status: Former Games developer  . Smokeless tobacco: Never Used  . Alcohol Use: No    Allergies: No Known Allergies  Prescriptions prior to admission  Medication Sig Dispense Refill  . acetaminophen (TYLENOL) 325 MG tablet Take 650 mg by mouth every 6 (six) hours as needed. For pain       . hydrOXYzine (VISTARIL) 25 MG capsule Take 25 mg by mouth 3 (three) times daily as needed. For headache       . ondansetron  (ZOFRAN-ODT) 8 MG disintegrating tablet Take 8 mg by mouth every 8 (eight) hours as needed. For nausea, patient just picked up medication on 09-27-11       . prenatal vitamin w/FE, FA (PRENATAL 1 + 1) 27-1 MG TABS Take 1 tablet by mouth daily.          Review of Systems  Constitutional: Negative for fever and chills.  Eyes: Negative for blurred vision.  Respiratory: Negative for cough.   Cardiovascular: Negative for chest pain.  Gastrointestinal: Positive for abdominal pain. Negative for nausea and vomiting.  Genitourinary: Negative for dysuria and urgency.  Musculoskeletal: Negative for back pain.  Skin: Negative for rash.  Neurological: Negative.  Negative for headaches.   Physical Exam   Blood pressure 109/62, pulse 75, temperature 97 F (36.1 C), temperature source Oral, resp. rate 18, height 5\' 3"  (1.6 m), weight 71.668 kg (158 lb), last menstrual period 03/27/2011.  Physical Exam  Constitutional: She is oriented to person, place, and time. No distress.  HENT:  Mouth/Throat: No oropharyngeal exudate.  Eyes: Conjunctivae are normal.  Neck: Neck supple.  Cardiovascular: Normal rate, regular rhythm and normal heart sounds.   No murmur heard. Respiratory: Breath sounds normal.  GI: Bowel sounds  are normal.       Normal vaginal exam of 35 w gravid pt.  Genitourinary:       Speculum: thick curd- like vaginal discharge. No amniotic fluid per OS with coughing. Bimanual: closed cervix, 60 % effacement, -2 station    Musculoskeletal: She exhibits no edema.  Neurological: She is alert and oriented to person, place, and time. She has normal reflexes.    MAU Course  Procedures  MDM FHT category 1. Wet Prep positive for yeasts Amnisure negative.  Assessment and Plan  41 y/o F G3P2002 with 35.6 w today with yeast vaginal infection and possible round ligament pain. Preterm labor symptoms discussed. Monistat 7. Tylenol for pain.  Pt discharged home. 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  D. [place] Sherron Flemings Paz. MD PGY-1 12/03/2011, 9:10 PM

## 2011-12-03 NOTE — ED Notes (Signed)
States that she is ready for her OB to write her out of work;

## 2011-12-03 NOTE — Progress Notes (Signed)
C/o vaginal and rectal pressure for past 3 months with the last 2 weeks being more intense;

## 2011-12-06 NOTE — ED Provider Notes (Signed)
Agree with above note.  [name] Kirby. 12/06/2011 12:09 PM  

## 2011-12-13 ENCOUNTER — Inpatient Hospital Stay (HOSPITAL_COMMUNITY)
Admission: AD | Admit: 2011-12-13 | Discharge: 2011-12-13 | Disposition: A | Discharge status: Home / Self Care | Payer: Medicaid Other | Source: Ambulatory Visit | Attending: Obstetrics and Gynecology | Admitting: Obstetrics and Gynecology

## 2011-12-13 ENCOUNTER — Encounter (HOSPITAL_COMMUNITY): Payer: Self-pay | Admitting: *Deleted

## 2011-12-13 DIAGNOSIS — B373 Candidiasis of vulva and vagina: Secondary | ICD-10-CM

## 2011-12-13 DIAGNOSIS — N949 Unspecified condition associated with female genital organs and menstrual cycle: Secondary | ICD-10-CM | POA: Insufficient documentation

## 2011-12-13 DIAGNOSIS — B3731 Acute candidiasis of vulva and vagina: Secondary | ICD-10-CM | POA: Insufficient documentation

## 2011-12-13 DIAGNOSIS — O2390 Unspecified genitourinary tract infection in pregnancy, unspecified trimester: Secondary | ICD-10-CM | POA: Insufficient documentation

## 2011-12-13 LAB — WET PREP, GENITAL: Clue Cells Wet Prep HPF POC: NONE SEEN

## 2011-12-13 LAB — AMNISURE RUPTURE OF MEMBRANE (ROM) NOT AT ARMC: Amnisure ROM: NEGATIVE

## 2011-12-13 NOTE — ED Provider Notes (Signed)
History     Chief Complaint  Patient presents with  . Rupture of Membranes   HPI 41 yo G3P2002 at 37 weeks 2 days seen in MAU for evaluation of possible rupture of membranes.  Woke up at 3am with wet underwear.  Thought that had wet herself and wet back to bed.  Also felt wet at work around [employer].  Denies decreased fetal activity, vaginal bleeding.  White vaginal discharge.  Occasional contraction.  OB History    Grav Para Term Preterm Abortions TAB SAB Ect Mult Living   3 2 2  0 0 0 0 0 0 2      Past Medical History  Diagnosis Date  . Headache   . Abnormal Pap smear   . Gonorrhea   . Trichomonas   . Depression     bipolar    Past Surgical History  Procedure Date  . Eye surgery   . Gynecologic cryosurgery   . Eye surgery     Family History  Problem Relation Age of Onset  . Anesthesia problems Neg Hx   . Hypotension Neg Hx   . Pseudochol deficiency Neg Hx   . Malignant hyperthermia Neg Hx   . Diabetes Mother   . Diabetes Maternal Grandmother   . Kidney disease Maternal Grandmother     History  Substance Use Topics  . Smoking status: Former Games developer  . Smokeless tobacco: Never Used  . Alcohol Use: No    Allergies: No Known Allergies  Prescriptions prior to admission  Medication Sig Dispense Refill  . acetaminophen (TYLENOL) 325 MG tablet Take 650 mg by mouth every 6 (six) hours as needed. For pain       . hydrOXYzine (VISTARIL) 25 MG capsule Take 25 mg by mouth 3 (three) times daily as needed. For headache       . prenatal vitamin w/FE, FA (PRENATAL 1 + 1) 27-1 MG TABS Take 1 tablet by mouth daily.          Review of Systems  All other systems reviewed and are negative.   Physical Exam   Blood pressure 113/60, pulse 90, temperature 97.3 F (36.3 C), temperature source Oral, resp. rate 18, height 5\' 2"  (1.575 m), weight 72.848 kg (160 lb 9.6 oz), last menstrual period 03/27/2011, SpO2 100.00%.  Physical Exam  Constitutional: She is oriented to person,  place, and time. She appears well-developed and well-nourished.  HENT:  Head: Normocephalic and atraumatic.  Cardiovascular: Normal rate, regular rhythm and normal heart sounds.   Respiratory: Effort normal and breath sounds normal.  GI: Soft. Bowel sounds are normal. She exhibits no distension.  Genitourinary:       Thick white discharge  Musculoskeletal: Normal range of motion.  Neurological: She is alert and oriented to person, place, and time.  Skin: Skin is warm and dry. No rash noted. No erythema. No pallor.  Psychiatric: She has a normal mood and affect. Her behavior is normal. Judgment and thought content normal.   Dilation: Closed Effacement (%): Thick Station: -3 Exam by:: Dr Buddy Duty prep - yeast Amnisure neg  MAU Course  Procedures  Assessment and Plan  1.  IUP at 37 weeks and 2 days 2.  Vaginal yeast infection  Patient has prescription for this.  Will send patient home to follow up with primary OB at next scheduled appt.  [name] Kirby JEHIEL 12/13/2011, 1:56 PM

## 2011-12-13 NOTE — Progress Notes (Signed)
Patient states she had wet in her panties about 0900. Having some pain especially when walking. Reports good fetal movement.

## 2011-12-13 NOTE — Discharge Instructions (Signed)
Candidal Vulvovaginitis Candidal vulvovaginitis is an infection of the vagina and vulva. The vulva is the skin around the opening of the vagina. This may cause itching and discomfort in and around the vagina.  HOME CARE  Only take medicine as told by your doctor.   Do not have sex (intercourse) until the infection is healed or as told by your doctor.   Practice safe sex.   Tell your sex partner about your infection.   Do not douche or use tampons.   Wear cotton underwear. Do not wear tight pants or panty hose.   Eat yogurt. This may help treat and prevent yeast infections.  GET HELP RIGHT AWAY IF:   You have a fever.   Your problems get worse during treatment or do not get better in 3 days.   You have discomfort, irritation, or itching in your vagina or vulva area.   You have pain after sex.   You start to get belly (abdominal) pain.  MAKE SURE YOU:  Understand these instructions.   Will watch your condition.   Will get help right away if you are not doing well or get worse.  Document Released: 01/12/2009 Document Revised: 06/28/2011 Document Reviewed: 01/12/2009 ExitCare Patient Information 2012 [employer]. 

## 2011-12-27 ENCOUNTER — Inpatient Hospital Stay (HOSPITAL_COMMUNITY)
Admission: AD | Admit: 2011-12-27 | Discharge: 2011-12-27 | Disposition: A | Discharge status: Home / Self Care | Payer: Medicaid Other | Source: Ambulatory Visit | Attending: Obstetrics & Gynecology | Admitting: Obstetrics & Gynecology

## 2011-12-27 ENCOUNTER — Encounter (HOSPITAL_COMMUNITY): Payer: Self-pay

## 2011-12-27 DIAGNOSIS — O99891 Other specified diseases and conditions complicating pregnancy: Secondary | ICD-10-CM | POA: Insufficient documentation

## 2011-12-27 DIAGNOSIS — N949 Unspecified condition associated with female genital organs and menstrual cycle: Secondary | ICD-10-CM

## 2011-12-27 DIAGNOSIS — R109 Unspecified abdominal pain: Secondary | ICD-10-CM | POA: Insufficient documentation

## 2011-12-27 NOTE — ED Provider Notes (Signed)
History     No chief complaint on file.  HPI Patient presents with pain left lower side of abdomen since Saturday. Unchanged since then. Has tried warm baths, increased water intake, nothing helps the pain. Pain is worse with lying down. Patient had similar pain with first pregnancy 20 years ago. Does not use a pregnancy belt but does use pillows to support belly. Pain is always there. Irregular, light contractions. No bleeding, no vaginal discharge, no gush of fluid. Good fetal movement.   OB History    Grav Para Term Preterm Abortions TAB SAB Ect Mult Living   3 2 2  0 0 0 0 0 0 2      Past Medical History  Diagnosis Date  . Headache   . Abnormal Pap smear   . Gonorrhea   . Trichomonas   . Depression     bipolar    Past Surgical History  Procedure Date  . Eye surgery   . Gynecologic cryosurgery   . Eye surgery     Family History  Problem Relation Age of Onset  . Anesthesia problems Neg Hx   . Hypotension Neg Hx   . Pseudochol deficiency Neg Hx   . Malignant hyperthermia Neg Hx   . Diabetes Mother   . Diabetes Maternal Grandmother   . Kidney disease Maternal Grandmother     History  Substance Use Topics  . Smoking status: Former Games developer  . Smokeless tobacco: Never Used  . Alcohol Use: No    Allergies: No Known Allergies  Prescriptions prior to admission  Medication Sig Dispense Refill  . acetaminophen (TYLENOL) 325 MG tablet Take 650 mg by mouth every 6 (six) hours as needed. For pain       . hydrOXYzine (VISTARIL) 25 MG capsule Take 25 mg by mouth 3 (three) times daily as needed. For headache       . Prenatal Vit-Fe Fumarate-FA (PRENATAL MULTIVITAMIN) TABS Take 1 tablet by mouth daily.        Review of Systems  Constitutional: Negative for fever and chills.  Eyes: Negative for blurred vision.  Respiratory: Positive for shortness of breath.   Cardiovascular: Negative for chest pain.  Gastrointestinal: Positive for abdominal pain. Negative for diarrhea and  constipation.  Genitourinary: Negative for dysuria.  Musculoskeletal: Positive for back pain.  Skin: Negative for rash.  Neurological: Negative for dizziness and headaches.   Physical Exam   Last menstrual period 03/27/2011.  Physical Exam  Constitutional: She appears well-developed and well-nourished. No distress.  HENT:  Head: Normocephalic and atraumatic.  Neck: Normal range of motion.  Cardiovascular: Normal rate and regular rhythm.   Respiratory: Effort normal and breath sounds normal.  GI: Soft. There is tenderness.       Gravid, toco in place  Musculoskeletal: Normal range of motion. She exhibits no edema.  Lymphadenopathy:    She has no cervical adenopathy.  Neurological: She is alert. No cranial nerve deficit.  Skin: Skin is warm and dry.    MAU Course  Procedures  FHT reassuring. Some irregular contractions noted on strip, likely uterine irritability  Cervical exam done with Sharen Counter CNM is closed.  Assessment and Plan  41 yo G2P2002 presenting with continuous abdominal pain - Cervix is closed, very posterior. Unchanged since office visit yesterday. - Most likely secondary to pubic symphysis pain vs round ligament pain. Given information about the pain and ways to help with pain including supporting the belly, warm baths, and Tylenol as needed - Labor  precautions given. Will return to the MAU if anything changes - Plan discussed with Sharen Counter, CNM.  - Patient discharged home in stable medical condition.  [name], [name] 12/27/2011, 10:38 AM

## 2011-12-27 NOTE — Discharge Instructions (Signed)
Continue to support your belly as much as you can. Warm baths/showers and Tylenol as needed as well. If anything changes, please return to the Maternity Admissions Unit to be evaluated again.

## 2011-12-27 NOTE — Progress Notes (Signed)
Pt states has had pain for weeks, now is painful when walking. Denies bleeding or lof. +FM.

## 2011-12-27 NOTE — ED Provider Notes (Signed)
Attestation of Attending Supervision of Resident: Evaluation and management procedures were performed by the [facility] Medicine Resident under my supervision.  I have reviewed the resident's note and chart, and I agree with management and plan.  Jaynie Collins, M.D. 12/27/2011 1:29 PM

## 2012-01-01 ENCOUNTER — Encounter (HOSPITAL_COMMUNITY): Payer: Self-pay | Admitting: *Deleted

## 2012-01-01 ENCOUNTER — Inpatient Hospital Stay (HOSPITAL_COMMUNITY)
Admission: AD | Admit: 2012-01-01 | Discharge: 2012-01-02 | Disposition: A | Discharge status: Home Health | Payer: Medicaid Other | Source: Ambulatory Visit | Attending: Obstetrics & Gynecology | Admitting: Obstetrics & Gynecology

## 2012-01-01 DIAGNOSIS — O99891 Other specified diseases and conditions complicating pregnancy: Secondary | ICD-10-CM | POA: Insufficient documentation

## 2012-01-01 DIAGNOSIS — R109 Unspecified abdominal pain: Secondary | ICD-10-CM | POA: Insufficient documentation

## 2012-01-01 NOTE — Progress Notes (Signed)
SAYS HURT BAD  SINCE YESTERDAY. VE    -   UNSURE.

## 2012-01-01 NOTE — Progress Notes (Signed)
PT DOES NOT WANT FATHER OF BABY TO KNOW SHE IS HERE- NOR HIS FAMILY

## 2012-01-02 ENCOUNTER — Other Ambulatory Visit (HOSPITAL_COMMUNITY): Payer: Self-pay | Admitting: Physician Assistant

## 2012-01-02 ENCOUNTER — Other Ambulatory Visit: Payer: Self-pay | Admitting: Obstetrics & Gynecology

## 2012-01-02 ENCOUNTER — Ambulatory Visit (HOSPITAL_COMMUNITY)
Admission: RE | Admit: 2012-01-02 | Discharge: 2012-01-02 | Disposition: A | Discharge status: Home / Self Care | Payer: Medicaid Other | Source: Ambulatory Visit | Attending: Physician Assistant | Admitting: Physician Assistant

## 2012-01-02 ENCOUNTER — Ambulatory Visit (HOSPITAL_COMMUNITY)
Admission: RE | Admit: 2012-01-02 | Discharge: 2012-01-02 | Disposition: A | Discharge status: Home / Self Care | Payer: Medicaid Other | Source: Ambulatory Visit | Attending: Obstetrics & Gynecology | Admitting: Obstetrics & Gynecology

## 2012-01-02 DIAGNOSIS — O288 Other abnormal findings on antenatal screening of mother: Secondary | ICD-10-CM

## 2012-01-02 DIAGNOSIS — O480 Post-term pregnancy: Secondary | ICD-10-CM

## 2012-01-02 DIAGNOSIS — O09219 Supervision of pregnancy with history of pre-term labor, unspecified trimester: Secondary | ICD-10-CM | POA: Insufficient documentation

## 2012-01-02 DIAGNOSIS — O09529 Supervision of elderly multigravida, unspecified trimester: Secondary | ICD-10-CM | POA: Insufficient documentation

## 2012-01-03 ENCOUNTER — Encounter (HOSPITAL_COMMUNITY): Payer: Self-pay | Admitting: *Deleted

## 2012-01-03 ENCOUNTER — Telehealth (HOSPITAL_COMMUNITY): Payer: Self-pay | Admitting: *Deleted

## 2012-01-03 NOTE — Telephone Encounter (Signed)
Preadmission screen  

## 2012-01-05 ENCOUNTER — Inpatient Hospital Stay (HOSPITAL_COMMUNITY): Payer: Medicaid Other

## 2012-01-05 ENCOUNTER — Encounter (HOSPITAL_COMMUNITY): Payer: Self-pay

## 2012-01-05 ENCOUNTER — Inpatient Hospital Stay (HOSPITAL_COMMUNITY)
Admission: AD | Admit: 2012-01-05 | Discharge: 2012-01-05 | Disposition: A | Discharge status: Home / Self Care | Payer: Medicaid Other | Source: Ambulatory Visit | Attending: Obstetrics & Gynecology | Admitting: Obstetrics & Gynecology

## 2012-01-05 ENCOUNTER — Other Ambulatory Visit: Payer: Medicaid Other

## 2012-01-05 DIAGNOSIS — O479 False labor, unspecified: Secondary | ICD-10-CM

## 2012-01-05 DIAGNOSIS — O480 Post-term pregnancy: Secondary | ICD-10-CM | POA: Insufficient documentation

## 2012-01-05 NOTE — Discharge Instructions (Signed)

## 2012-01-05 NOTE — Progress Notes (Signed)
Patient states she was at [facility] today and was told the baby's heart was "low" and sent to MAU for evaluation. Patient state she had a little watery discharge last night and having some contractions.

## 2012-01-05 NOTE — ED Provider Notes (Signed)
History     Chief Complaint  Patient presents with  . Non-stress Test   HPI Pt is a 41 yo G3P2002 at [redacted]w[redacted]d presenting to MAU at suggestion of [facility]. Patient went to [facility] today for NST for post dates and baby's HR was low. She states she had some fluid leak last night but none since then. +Fetal movement, contractions irregular every 5-10 minutes. Patient's cervix was closed at clinic 3 days ago. Cervix was not rechecked today at clinic  OB History    Grav Para Term Preterm Abortions TAB SAB Ect Mult Living   3 2 2  0 0 0 0 0 0 2      Past Medical History  Diagnosis Date  . Headache   . Abnormal Pap smear   . Gonorrhea   . Trichomonas   . Depression     bipolar    Past Surgical History  Procedure Date  . Eye surgery   . Gynecologic cryosurgery   . Eye surgery     Family History  Problem Relation Age of Onset  . Anesthesia problems Neg Hx   . Hypotension Neg Hx   . Pseudochol deficiency Neg Hx   . Malignant hyperthermia Neg Hx   . Diabetes Mother   . Hypertension Mother   . Diabetes Maternal Grandmother   . Kidney disease Maternal Grandmother     History  Substance Use Topics  . Smoking status: Former Smoker    Quit date: 01/03/2007  . Smokeless tobacco: Never Used  . Alcohol Use: No    Allergies: No Known Allergies  Prescriptions prior to admission  Medication Sig Dispense Refill  . acetaminophen (TYLENOL) 325 MG tablet Take 650 mg by mouth every 6 (six) hours as needed. For pain       . hydrOXYzine (VISTARIL) 25 MG capsule Take 25 mg by mouth 3 (three) times daily as needed. For headache       . Prenatal Vit-Fe Fumarate-FA (PRENATAL MULTIVITAMIN) TABS Take 1 tablet by mouth daily.        Review of Systems  Constitutional: Negative for fever.  Respiratory: Negative for shortness of breath.   Cardiovascular: Negative for chest pain.  Gastrointestinal: Negative for nausea and vomiting.  Genitourinary: Negative for dysuria.    Musculoskeletal: Positive for back pain.  Neurological: Negative for dizziness and headaches.  Psychiatric/Behavioral: Negative for depression.    Physical Exam   Blood pressure 130/62, pulse 71, temperature 97.5 F (36.4 C), temperature source Oral, resp. rate 16, height 5' 1.5" (1.562 m), weight 74.844 kg (165 lb), last menstrual period 03/27/2011, SpO2 100.00%.  Physical Exam  Constitutional: She is oriented to person, place, and time. She appears well-developed and well-nourished. No distress.  HENT:  Head: Normocephalic and atraumatic.  Neck: Normal range of motion.  Cardiovascular: Normal rate and regular rhythm.   Respiratory: Effort normal and breath sounds normal.  GI: Soft.       Gravid, toco in place   Musculoskeletal: Normal range of motion. She exhibits no edema.  Neurological: She is alert and oriented to person, place, and time.  Skin: Skin is warm and dry.    MAU Course  Procedures  MDM Patient observed on monitor. FHT with minimal variability. Will do BPP Dilation: Closed Effacement (%): Thick Cervical Position: Posterior Station: -3 Presentation: Vertex (per ultrasound) Exam by:: Lucy Chris RNC   Assessment and Plan  41 yo G3P2002 at [redacted]w[redacted]d presenting for observation - Irregular contractions every 7-12 minutes. No  change in cervix. - BPP 8/8 - Patient given labor precautions. Will return to MAU if anything changes, otherwise will [facility] - Plan discussed with Sid Falcon CNM   Seaton Hofmann 01/05/2012, 2:44 PM

## 2012-01-05 NOTE — ED Provider Notes (Signed)
Attestation of Attending Supervision of Resident: Evaluation and management procedures were performed by the Family Medicine Resident under my supervision.  I have reviewed the resident's note and chart, and I agree with management and plan.  Silviano Neuser, M.D. 01/05/2012 3:13 PM    

## 2012-01-09 ENCOUNTER — Ambulatory Visit (INDEPENDENT_AMBULATORY_CARE_PROVIDER_SITE_OTHER): Payer: Medicaid Other | Admitting: *Deleted

## 2012-01-09 VITALS — BP 125/67 | Wt 167.2 lb

## 2012-01-09 DIAGNOSIS — O480 Post-term pregnancy: Secondary | ICD-10-CM

## 2012-01-09 NOTE — Progress Notes (Signed)
P = 82    Pt is scheduled for IOL on 01/11/12 @ 1930.

## 2012-01-09 NOTE — Progress Notes (Signed)
NST performed on 01/09/2012 was reviewed and was found to be reactive.  Continue recommended antenatal testing and prenatal care.    

## 2012-01-11 ENCOUNTER — Inpatient Hospital Stay (HOSPITAL_COMMUNITY)
Admission: RE | Admit: 2012-01-11 | Discharge: 2012-01-15 | DRG: 781 | Disposition: A | Discharge status: Home / Self Care | Payer: Medicaid Other | Source: Ambulatory Visit | Attending: Obstetrics & Gynecology | Admitting: Obstetrics & Gynecology

## 2012-01-11 VITALS — BP 117/67 | HR 80 | Temp 98.0°F | Resp 18 | Wt 167.2 lb

## 2012-01-11 DIAGNOSIS — O99340 Other mental disorders complicating pregnancy, unspecified trimester: Secondary | ICD-10-CM | POA: Diagnosis present

## 2012-01-11 DIAGNOSIS — O480 Post-term pregnancy: Principal | ICD-10-CM | POA: Diagnosis present

## 2012-01-11 DIAGNOSIS — F3130 Bipolar disorder, current episode depressed, mild or moderate severity, unspecified: Secondary | ICD-10-CM | POA: Diagnosis present

## 2012-01-11 DIAGNOSIS — F319 Bipolar disorder, unspecified: Secondary | ICD-10-CM | POA: Diagnosis present

## 2012-01-11 LAB — CBC
HCT: 33.7 % — ABNORMAL LOW (ref 36.0–46.0)
MCH: 31.8 pg (ref 26.0–34.0)
MCV: 94.9 fL (ref 78.0–100.0)
Platelets: 203 10*3/uL (ref 150–400)
RBC: 3.55 MIL/uL — ABNORMAL LOW (ref 3.87–5.11)

## 2012-01-11 MED ORDER — ONDANSETRON HCL 4 MG/2ML IJ SOLN: 4.0000 mg | Freq: Four times a day (QID) | INTRAMUSCULAR | Status: DC | PRN

## 2012-01-11 MED ORDER — HYDROXYZINE HCL 50 MG PO TABS: 50.0000 mg | ORAL_TABLET | Freq: Four times a day (QID) | ORAL | Status: DC | PRN

## 2012-01-11 MED ORDER — PENICILLIN G POTASSIUM 5000000 UNITS IJ SOLR
5.0000 10*6.[IU] | Freq: Once | INTRAVENOUS | Status: AC
  Administered 2012-01-12: 5 10*6.[IU] via INTRAVENOUS
  Filled 2012-01-11: qty 5

## 2012-01-11 MED ORDER — OXYTOCIN 20 UNITS IN LACTATED RINGERS INFUSION - SIMPLE: 125.0000 mL/h | Freq: Once | INTRAVENOUS | Status: DC

## 2012-01-11 MED ORDER — HYDROXYZINE HCL 50 MG/ML IM SOLN
50.0000 mg | Freq: Four times a day (QID) | INTRAMUSCULAR | Status: DC | PRN
  Administered 2012-01-12: 50 mg via INTRAMUSCULAR
  Filled 2012-01-11: qty 1

## 2012-01-11 MED ORDER — SODIUM CHLORIDE 0.9 % IV SOLN: 250.0000 mL | INTRAVENOUS | Status: DC | PRN

## 2012-01-11 MED ORDER — IBUPROFEN 600 MG PO TABS: 600.0000 mg | ORAL_TABLET | Freq: Four times a day (QID) | ORAL | Status: DC | PRN

## 2012-01-11 MED ORDER — OXYCODONE-ACETAMINOPHEN 5-325 MG PO TABS: 1.0000 | ORAL_TABLET | ORAL | Status: DC | PRN

## 2012-01-11 MED ORDER — SODIUM CHLORIDE 0.9 % IJ SOLN
3.0000 mL | INTRAMUSCULAR | Status: DC | PRN
  Administered 2012-01-12: 3 mL via INTRAVENOUS

## 2012-01-11 MED ORDER — OXYTOCIN BOLUS FROM INFUSION
500.0000 mL | Freq: Once | INTRAVENOUS | Status: DC
  Filled 2012-01-11: qty 1000
  Filled 2012-01-11: qty 500

## 2012-01-11 MED ORDER — ACETAMINOPHEN 325 MG PO TABS: 650.0000 mg | ORAL_TABLET | ORAL | Status: DC | PRN

## 2012-01-11 MED ORDER — NALBUPHINE SYRINGE 5 MG/0.5 ML
5.0000 mg | INJECTION | INTRAMUSCULAR | Status: DC | PRN
  Filled 2012-01-11: qty 0.5

## 2012-01-11 MED ORDER — LACTATED RINGERS IV SOLN
500.0000 mL | INTRAVENOUS | Status: DC | PRN
  Administered 2012-01-12 (×2): 500 mL via INTRAVENOUS

## 2012-01-11 MED ORDER — SODIUM CHLORIDE 0.9 % IJ SOLN: 3.0000 mL | Freq: Two times a day (BID) | INTRAMUSCULAR | Status: DC

## 2012-01-11 MED ORDER — TERBUTALINE SULFATE 1 MG/ML IJ SOLN: 0.2500 mg | Freq: Once | INTRAMUSCULAR | Status: AC | PRN

## 2012-01-11 MED ORDER — LIDOCAINE HCL (PF) 1 % IJ SOLN: 30.0000 mL | INTRAMUSCULAR | Status: DC | PRN

## 2012-01-11 MED ORDER — ZOLPIDEM TARTRATE 5 MG PO TABS: 5.0000 mg | ORAL_TABLET | Freq: Every evening | ORAL | Status: DC | PRN

## 2012-01-11 MED ORDER — MISOPROSTOL 25 MCG QUARTER TABLET
25.0000 ug | ORAL_TABLET | ORAL | Status: DC | PRN
  Administered 2012-01-11 – 2012-01-12 (×2): 25 ug via VAGINAL
  Filled 2012-01-11 (×2): qty 0.25

## 2012-01-11 MED ORDER — PENICILLIN G POTASSIUM 5000000 UNITS IJ SOLR
2.5000 10*6.[IU] | INTRAVENOUS | Status: DC
  Filled 2012-01-11 (×2): qty 2.5

## 2012-01-11 MED ORDER — CITRIC ACID-SODIUM CITRATE 334-500 MG/5ML PO SOLN
30.0000 mL | ORAL | Status: DC | PRN
  Administered 2012-01-12: 30 mL via ORAL

## 2012-01-11 MED ORDER — LACTATED RINGERS IV SOLN
INTRAVENOUS | Status: DC
  Administered 2012-01-12: 125 mL/h via INTRAVENOUS
  Administered 2012-01-12: 02:00:00 via INTRAVENOUS

## 2012-01-11 MED ORDER — FLEET ENEMA 7-19 GM/118ML RE ENEM: 1.0000 | ENEMA | RECTAL | Status: DC | PRN

## 2012-01-11 NOTE — H&P (Signed)
Attestation of Attending Supervision of Resident: Evaluation and management procedures were performed by the [facility] Medicine Resident under my supervision.  I have reviewed the resident's note and chart, and I agree with management and plan.  Jaynie Collins, M.D. 01/11/2012 11:21 PM

## 2012-01-11 NOTE — H&P (Signed)
[name] Kirby is a 41 y.o. female G3P2002 at [redacted]w[redacted]d presenting for IOL for post dates. [facility] She is GBS positive. Patient has a PMH of bipolar disorder. She has not taken any medications during pregnancy for this. Patient feels good fetal movement. Denies LOF or vaginal bleeding. She is having irregular contractions. Patient plans to breast and bottle feed. Pediatrician is Dr. Talmage Nap. Pt plans to use Depo for PP contraception. Would like IV pain medication and epidural for pain control.  History OB History    Grav Para Term Preterm Abortions TAB SAB Ect Mult Living   3 2 2  0 0 0 0 0 0 2    2 uncomplicated vaginal deliveries.  Past Medical History  Diagnosis Date  . Headache   . Abnormal Pap smear   . Gonorrhea   . Trichomonas   . Depression     bipolar   Past Surgical History  Procedure Date  . Eye surgery   . Gynecologic cryosurgery   . Eye surgery    Family History: family history includes Diabetes in her maternal grandmother and mother; Hypertension in her mother; and Kidney disease in her maternal grandmother.  There is no history of Anesthesia problems, and Hypotension, and Pseudochol deficiency, and Malignant hyperthermia, . Social History:  reports that she quit smoking about 5 years ago. She has never used smokeless tobacco. She reports that she does not drink alcohol or use illicit drugs.  Review of Systems  Constitutional: Negative for fever and chills.  Respiratory: Negative for shortness of breath.   Cardiovascular: Negative for chest pain.  Gastrointestinal: Negative for vomiting.  Genitourinary: Negative for dysuria.  Skin: Negative for rash.  Neurological: Negative for headaches.     Blood pressure 135/70, pulse 70, temperature 98.2 F (36.8 C), temperature source Oral, resp. rate 18, last menstrual period 03/27/2011. Exam Physical Exam  Constitutional: She is oriented to person, place, and time. She appears well-developed  and well-nourished. No distress.  HENT:  Head: Normocephalic and atraumatic.  Neck: Normal range of motion.  Cardiovascular: Normal rate and regular rhythm.   No murmur heard. Respiratory: Effort normal and breath sounds normal. She has no wheezes.  GI: Soft.       Gravid. Toco in place.  Musculoskeletal: Normal range of motion. She exhibits no edema and no tenderness.  Neurological: She is alert and oriented to person, place, and time. No cranial nerve deficit.  Skin: Skin is dry. No rash noted.     Prenatal labs: ABO, Rh: B/Positive/-- (08/12 0000) Antibody: Negative (08/12 0000) Rubella: Immune (11/29 0000) RPR: Nonreactive (08/06 0000)  HBsAg: Negative (11/29 0000)  HIV: Non-reactive (11/29 0000)  GBS: Positive (08/08 0000)   Assessment/Plan: 41 yo G3P002 at [redacted]w[redacted]d presenting for IOL for post dates  - Admit to birthing suite, attending Dr. Macon Large - Cervix is closed and thick. Will start Cytotec q4 hours for cervical ripening. Will place foley bulb, if needed. - Encouraged patient to rest. Continue liquid diet.  - Will continue active management and monitor patient - Plan discussed with Maylon Cos CNM  Morry Veiga 01/11/2012, 8:43 PM

## 2012-01-12 ENCOUNTER — Inpatient Hospital Stay (HOSPITAL_COMMUNITY): Payer: Medicaid Other | Admitting: Anesthesiology

## 2012-01-12 ENCOUNTER — Encounter (HOSPITAL_COMMUNITY): Payer: Self-pay | Admitting: Anesthesiology

## 2012-01-12 ENCOUNTER — Encounter (HOSPITAL_COMMUNITY)
Admission: RE | Disposition: A | Discharge status: Home / Self Care | Payer: Self-pay | Source: Ambulatory Visit | Attending: Obstetrics & Gynecology

## 2012-01-12 ENCOUNTER — Encounter (HOSPITAL_COMMUNITY): Payer: Self-pay

## 2012-01-12 DIAGNOSIS — O480 Post-term pregnancy: Secondary | ICD-10-CM

## 2012-01-12 DIAGNOSIS — F319 Bipolar disorder, unspecified: Secondary | ICD-10-CM

## 2012-01-12 DIAGNOSIS — F3130 Bipolar disorder, current episode depressed, mild or moderate severity, unspecified: Secondary | ICD-10-CM

## 2012-01-12 DIAGNOSIS — O99340 Other mental disorders complicating pregnancy, unspecified trimester: Secondary | ICD-10-CM

## 2012-01-12 SURGERY — Surgical Case
Anesthesia: Epidural | Site: Abdomen | Wound class: Clean Contaminated

## 2012-01-12 MED ORDER — OXYTOCIN 20 UNITS IN LACTATED RINGERS INFUSION - SIMPLE
125.0000 mL/h | INTRAVENOUS | Status: AC
  Administered 2012-01-12: 125 mL/h via INTRAVENOUS
  Filled 2012-01-12: qty 1000

## 2012-01-12 MED ORDER — LIDOCAINE-EPINEPHRINE (PF) 2 %-1:200000 IJ SOLN
INTRAMUSCULAR | Status: AC
  Filled 2012-01-12: qty 20

## 2012-01-12 MED ORDER — DIPHENHYDRAMINE HCL 50 MG/ML IJ SOLN: 12.5000 mg | INTRAMUSCULAR | Status: DC | PRN

## 2012-01-12 MED ORDER — OXYTOCIN 10 UNIT/ML IJ SOLN
INTRAMUSCULAR | Status: DC | PRN
  Administered 2012-01-12: 20 [IU] via INTRAMUSCULAR

## 2012-01-12 MED ORDER — CEFAZOLIN SODIUM 1-5 GM-% IV SOLN
INTRAVENOUS | Status: DC | PRN
  Administered 2012-01-12: 1 g via INTRAVENOUS

## 2012-01-12 MED ORDER — NALBUPHINE HCL 10 MG/ML IJ SOLN: 5.0000 mg | INTRAMUSCULAR | Status: DC | PRN

## 2012-01-12 MED ORDER — EPHEDRINE 5 MG/ML INJ
10.0000 mg | INTRAVENOUS | Status: DC | PRN
  Filled 2012-01-12: qty 4

## 2012-01-12 MED ORDER — IBUPROFEN 600 MG PO TABS
600.0000 mg | ORAL_TABLET | Freq: Four times a day (QID) | ORAL | Status: DC
  Administered 2012-01-13 – 2012-01-15 (×10): 600 mg via ORAL
  Filled 2012-01-12 (×10): qty 1

## 2012-01-12 MED ORDER — KETOROLAC TROMETHAMINE 30 MG/ML IJ SOLN: 30.0000 mg | Freq: Four times a day (QID) | INTRAMUSCULAR | Status: AC | PRN

## 2012-01-12 MED ORDER — DIPHENHYDRAMINE HCL 25 MG PO CAPS: 25.0000 mg | ORAL_CAPSULE | ORAL | Status: DC | PRN

## 2012-01-12 MED ORDER — IBUPROFEN 600 MG PO TABS: 600.0000 mg | ORAL_TABLET | Freq: Four times a day (QID) | ORAL | Status: DC | PRN

## 2012-01-12 MED ORDER — TETANUS-DIPHTH-ACELL PERTUSSIS 5-2.5-18.5 LF-MCG/0.5 IM SUSP: 0.5000 mL | Freq: Once | INTRAMUSCULAR | Status: DC

## 2012-01-12 MED ORDER — OXYTOCIN 10 UNIT/ML IJ SOLN
INTRAMUSCULAR | Status: AC
  Filled 2012-01-12: qty 2

## 2012-01-12 MED ORDER — MENTHOL 3 MG MT LOZG: 1.0000 | LOZENGE | OROMUCOSAL | Status: DC | PRN

## 2012-01-12 MED ORDER — PROMETHAZINE HCL 25 MG/ML IJ SOLN: 6.2500 mg | INTRAMUSCULAR | Status: DC | PRN

## 2012-01-12 MED ORDER — SIMETHICONE 80 MG PO CHEW
80.0000 mg | CHEWABLE_TABLET | Freq: Three times a day (TID) | ORAL | Status: DC
  Administered 2012-01-13 – 2012-01-15 (×8): 80 mg via ORAL

## 2012-01-12 MED ORDER — LACTATED RINGERS IV SOLN: 500.0000 mL | Freq: Once | INTRAVENOUS | Status: DC

## 2012-01-12 MED ORDER — MORPHINE SULFATE (PF) 0.5 MG/ML IJ SOLN
INTRAMUSCULAR | Status: DC | PRN
  Administered 2012-01-12: 4 mg via EPIDURAL

## 2012-01-12 MED ORDER — DIPHENHYDRAMINE HCL 50 MG/ML IJ SOLN: 25.0000 mg | INTRAMUSCULAR | Status: DC | PRN

## 2012-01-12 MED ORDER — PHENYLEPHRINE 40 MCG/ML (10ML) SYRINGE FOR IV PUSH (FOR BLOOD PRESSURE SUPPORT): 80.0000 ug | PREFILLED_SYRINGE | INTRAVENOUS | Status: DC | PRN

## 2012-01-12 MED ORDER — NALBUPHINE SYRINGE 5 MG/0.5 ML
10.0000 mg | INJECTION | Freq: Once | INTRAMUSCULAR | Status: AC
  Administered 2012-01-12: 10 mg via INTRAMUSCULAR

## 2012-01-12 MED ORDER — TERBUTALINE SULFATE 1 MG/ML IJ SOLN: 0.2500 mg | Freq: Once | INTRAMUSCULAR | Status: DC | PRN

## 2012-01-12 MED ORDER — SCOPOLAMINE 1 MG/3DAYS TD PT72: 1.0000 | MEDICATED_PATCH | Freq: Once | TRANSDERMAL | Status: DC

## 2012-01-12 MED ORDER — MEPERIDINE HCL 25 MG/ML IJ SOLN: 6.2500 mg | INTRAMUSCULAR | Status: DC | PRN

## 2012-01-12 MED ORDER — SENNOSIDES-DOCUSATE SODIUM 8.6-50 MG PO TABS
2.0000 | ORAL_TABLET | Freq: Every day | ORAL | Status: DC
  Administered 2012-01-13 – 2012-01-14 (×2): 2 via ORAL

## 2012-01-12 MED ORDER — PHENYLEPHRINE 40 MCG/ML (10ML) SYRINGE FOR IV PUSH (FOR BLOOD PRESSURE SUPPORT)
80.0000 ug | PREFILLED_SYRINGE | INTRAVENOUS | Status: DC | PRN
  Filled 2012-01-12: qty 5

## 2012-01-12 MED ORDER — ONDANSETRON HCL 4 MG/2ML IJ SOLN
INTRAMUSCULAR | Status: AC
  Filled 2012-01-12: qty 2

## 2012-01-12 MED ORDER — DIBUCAINE 1 % RE OINT: 1.0000 "application " | TOPICAL_OINTMENT | RECTAL | Status: DC | PRN

## 2012-01-12 MED ORDER — NALOXONE HCL 0.4 MG/ML IJ SOLN: 0.4000 mg | INTRAMUSCULAR | Status: DC | PRN

## 2012-01-12 MED ORDER — MIDAZOLAM HCL 2 MG/2ML IJ SOLN: 0.5000 mg | Freq: Once | INTRAMUSCULAR | Status: DC | PRN

## 2012-01-12 MED ORDER — ONDANSETRON HCL 4 MG PO TABS: 4.0000 mg | ORAL_TABLET | ORAL | Status: DC | PRN

## 2012-01-12 MED ORDER — ONDANSETRON HCL 4 MG/2ML IJ SOLN
4.0000 mg | INTRAMUSCULAR | Status: DC | PRN
  Administered 2012-01-12: 4 mg via INTRAVENOUS
  Filled 2012-01-12: qty 2

## 2012-01-12 MED ORDER — SODIUM BICARBONATE 8.4 % IV SOLN
INTRAVENOUS | Status: AC
  Filled 2012-01-12: qty 50

## 2012-01-12 MED ORDER — ACETAMINOPHEN 325 MG PO TABS: 325.0000 mg | ORAL_TABLET | ORAL | Status: DC | PRN

## 2012-01-12 MED ORDER — FENTANYL 2.5 MCG/ML BUPIVACAINE 1/10 % EPIDURAL INFUSION (WH - ANES)
14.0000 mL/h | INTRAMUSCULAR | Status: DC
  Administered 2012-01-12: 14 mL/h via EPIDURAL
  Filled 2012-01-12: qty 60

## 2012-01-12 MED ORDER — SODIUM CHLORIDE 0.9 % IV SOLN: 1.0000 ug/kg/h | INTRAVENOUS | Status: DC | PRN

## 2012-01-12 MED ORDER — EPHEDRINE 5 MG/ML INJ: 10.0000 mg | INTRAVENOUS | Status: DC | PRN

## 2012-01-12 MED ORDER — ACETAMINOPHEN 10 MG/ML IV SOLN: 1000.0000 mg | Freq: Four times a day (QID) | INTRAVENOUS | Status: AC | PRN

## 2012-01-12 MED ORDER — MORPHINE SULFATE 0.5 MG/ML IJ SOLN
INTRAMUSCULAR | Status: AC
  Filled 2012-01-12: qty 10

## 2012-01-12 MED ORDER — DIPHENHYDRAMINE HCL 50 MG/ML IJ SOLN
12.5000 mg | INTRAMUSCULAR | Status: DC | PRN
  Administered 2012-01-12: 50 mg via INTRAVENOUS
  Filled 2012-01-12: qty 1

## 2012-01-12 MED ORDER — ONDANSETRON HCL 4 MG/2ML IJ SOLN
INTRAMUSCULAR | Status: DC | PRN
  Administered 2012-01-12: 4 mg via INTRAVENOUS

## 2012-01-12 MED ORDER — LIDOCAINE HCL (PF) 1 % IJ SOLN
INTRAMUSCULAR | Status: DC | PRN
  Administered 2012-01-12 (×2): 5 mL

## 2012-01-12 MED ORDER — DIPHENHYDRAMINE HCL 25 MG PO CAPS: 25.0000 mg | ORAL_CAPSULE | Freq: Four times a day (QID) | ORAL | Status: DC | PRN

## 2012-01-12 MED ORDER — OXYTOCIN 20 UNITS IN LACTATED RINGERS INFUSION - SIMPLE
1.0000 m[IU]/min | INTRAVENOUS | Status: DC
  Administered 2012-01-12: 2 m[IU]/min via INTRAVENOUS

## 2012-01-12 MED ORDER — KETOROLAC TROMETHAMINE 30 MG/ML IJ SOLN
INTRAMUSCULAR | Status: AC
  Administered 2012-01-12: 30 mg via INTRAVENOUS
  Filled 2012-01-12: qty 1

## 2012-01-12 MED ORDER — ONDANSETRON HCL 4 MG/2ML IJ SOLN: 4.0000 mg | Freq: Three times a day (TID) | INTRAMUSCULAR | Status: DC | PRN

## 2012-01-12 MED ORDER — PRENATAL MULTIVITAMIN CH
1.0000 | ORAL_TABLET | Freq: Every day | ORAL | Status: DC
  Administered 2012-01-13 – 2012-01-15 (×3): 1 via ORAL
  Filled 2012-01-12 (×3): qty 1

## 2012-01-12 MED ORDER — LACTATED RINGERS IV SOLN: INTRAVENOUS | Status: DC

## 2012-01-12 MED ORDER — NALBUPHINE SYRINGE 5 MG/0.5 ML
10.0000 mg | INJECTION | Freq: Once | INTRAMUSCULAR | Status: AC
  Administered 2012-01-12: 10 mg via INTRAVENOUS
  Filled 2012-01-12: qty 0.5
  Filled 2012-01-12: qty 1

## 2012-01-12 MED ORDER — MEPERIDINE HCL 25 MG/ML IJ SOLN
6.2500 mg | INTRAMUSCULAR | Status: DC | PRN
Start: 2012-01-12 — End: 2012-01-12
  Administered 2012-01-12: 12.5 mg via INTRAVENOUS

## 2012-01-12 MED ORDER — WITCH HAZEL-GLYCERIN EX PADS: 1.0000 "application " | MEDICATED_PAD | CUTANEOUS | Status: DC | PRN

## 2012-01-12 MED ORDER — LACTATED RINGERS IV SOLN
INTRAVENOUS | Status: DC | PRN
  Administered 2012-01-12 (×2): via INTRAVENOUS

## 2012-01-12 MED ORDER — LANOLIN HYDROUS EX OINT: 1.0000 "application " | TOPICAL_OINTMENT | CUTANEOUS | Status: DC | PRN

## 2012-01-12 MED ORDER — METOCLOPRAMIDE HCL 5 MG/ML IJ SOLN: 10.0000 mg | Freq: Three times a day (TID) | INTRAMUSCULAR | Status: DC | PRN

## 2012-01-12 MED ORDER — ZOLPIDEM TARTRATE 5 MG PO TABS: 5.0000 mg | ORAL_TABLET | Freq: Every evening | ORAL | Status: DC | PRN

## 2012-01-12 MED ORDER — OXYCODONE-ACETAMINOPHEN 5-325 MG PO TABS
1.0000 | ORAL_TABLET | ORAL | Status: DC | PRN
  Administered 2012-01-13 – 2012-01-14 (×2): 1 via ORAL
  Filled 2012-01-12 (×2): qty 1

## 2012-01-12 MED ORDER — KETOROLAC TROMETHAMINE 30 MG/ML IJ SOLN
30.0000 mg | Freq: Four times a day (QID) | INTRAMUSCULAR | Status: AC | PRN
  Administered 2012-01-12: 30 mg via INTRAVENOUS

## 2012-01-12 MED ORDER — SODIUM CHLORIDE 0.9 % IJ SOLN: 3.0000 mL | INTRAMUSCULAR | Status: DC | PRN

## 2012-01-12 MED ORDER — CITRIC ACID-SODIUM CITRATE 334-500 MG/5ML PO SOLN
ORAL | Status: AC
  Administered 2012-01-12: 30 mL via ORAL
  Filled 2012-01-12: qty 15

## 2012-01-12 MED ORDER — MEPERIDINE HCL 25 MG/ML IJ SOLN
INTRAMUSCULAR | Status: AC
  Administered 2012-01-12: 12.5 mg via INTRAVENOUS
  Filled 2012-01-12: qty 1

## 2012-01-12 MED ORDER — OXYTOCIN 20 UNITS IN LACTATED RINGERS INFUSION - SIMPLE: 1.0000 m[IU]/min | INTRAVENOUS | Status: DC

## 2012-01-12 MED ORDER — FENTANYL CITRATE 0.05 MG/ML IJ SOLN
INTRAMUSCULAR | Status: AC
  Administered 2012-01-12: 50 ug via INTRAVENOUS
  Filled 2012-01-12: qty 2

## 2012-01-12 MED ORDER — SODIUM BICARBONATE 8.4 % IV SOLN
INTRAVENOUS | Status: DC | PRN
  Administered 2012-01-12: 5 mL via EPIDURAL

## 2012-01-12 MED ORDER — SIMETHICONE 80 MG PO CHEW: 80.0000 mg | CHEWABLE_TABLET | ORAL | Status: DC | PRN

## 2012-01-12 MED ORDER — CEFAZOLIN SODIUM 1-5 GM-% IV SOLN
INTRAVENOUS | Status: AC
  Filled 2012-01-12: qty 50

## 2012-01-12 MED ORDER — FENTANYL CITRATE 0.05 MG/ML IJ SOLN
25.0000 ug | INTRAMUSCULAR | Status: DC | PRN
  Administered 2012-01-12 (×2): 50 ug via INTRAVENOUS

## 2012-01-12 SURGICAL SUPPLY — 35 items
APL SKNCLS STERI-STRIP NONHPOA (GAUZE/BANDAGES/DRESSINGS) ×1
BARRIER ADHS 3X4 INTERCEED (GAUZE/BANDAGES/DRESSINGS) IMPLANT
BENZOIN TINCTURE PRP APPL 2/3 (GAUZE/BANDAGES/DRESSINGS) ×1 IMPLANT
BRR ADH 4X3 ABS CNTRL BYND (GAUZE/BANDAGES/DRESSINGS)
CHLORAPREP W/TINT 26ML (MISCELLANEOUS) ×2 IMPLANT
CLOTH BEACON ORANGE TIMEOUT ST (SAFETY) ×2 IMPLANT
DRESSING TELFA 8X3 (GAUZE/BANDAGES/DRESSINGS) ×1 IMPLANT
ELECT REM PT RETURN 9FT ADLT (ELECTROSURGICAL) ×2
ELECTRODE REM PT RTRN 9FT ADLT (ELECTROSURGICAL) ×1 IMPLANT
EXTRACTOR VACUUM KIWI (MISCELLANEOUS) IMPLANT
GLOVE BIO SURGEON STRL SZ 6.5 (GLOVE) ×5 IMPLANT
GLOVE BIOGEL PI IND STRL 7.0 (GLOVE) ×2 IMPLANT
GLOVE BIOGEL PI IND STRL 7.5 (GLOVE) IMPLANT
GLOVE BIOGEL PI INDICATOR 7.0 (GLOVE) ×3
GLOVE BIOGEL PI INDICATOR 7.5 (GLOVE) ×3
GOWN PREVENTION PLUS LG XLONG (DISPOSABLE) ×8 IMPLANT
KIT ABG SYR 3ML LUER SLIP (SYRINGE) IMPLANT
NDL HYPO 25X5/8 SAFETYGLIDE (NEEDLE) IMPLANT
NEEDLE HYPO 25X5/8 SAFETYGLIDE (NEEDLE) IMPLANT
NS IRRIG 1000ML POUR BTL (IV SOLUTION) ×2 IMPLANT
PACK C SECTION WH (CUSTOM PROCEDURE TRAY) ×2 IMPLANT
PAD ABD 7.5X8 STRL (GAUZE/BANDAGES/DRESSINGS) ×1 IMPLANT
RTRCTR C-SECT PINK 25CM LRG (MISCELLANEOUS) ×1 IMPLANT
SLEEVE SCD COMPRESS KNEE LRG (MISCELLANEOUS) IMPLANT
SLEEVE SCD COMPRESS KNEE MED (MISCELLANEOUS) IMPLANT
SPONGE GAUZE 4X4 12PLY (GAUZE/BANDAGES/DRESSINGS) ×1 IMPLANT
STRIP CLOSURE SKIN 1/2X4 (GAUZE/BANDAGES/DRESSINGS) ×1 IMPLANT
SUT VIC AB 0 CT1 36 (SUTURE) ×12 IMPLANT
SUT VIC AB 2-0 CT1 27 (SUTURE) ×2
SUT VIC AB 2-0 CT1 TAPERPNT 27 (SUTURE) ×1 IMPLANT
SUT VIC AB 4-0 PS2 27 (SUTURE) ×2 IMPLANT
TAPE CLOTH SURG 4X10 WHT LF (GAUZE/BANDAGES/DRESSINGS) ×1 IMPLANT
TOWEL OR 17X24 6PK STRL BLUE (TOWEL DISPOSABLE) ×4 IMPLANT
TRAY FOLEY CATH 14FR (SET/KITS/TRAYS/PACK) IMPLANT
WATER STERILE IRR 1000ML POUR (IV SOLUTION) ×2 IMPLANT

## 2012-01-12 NOTE — Progress Notes (Signed)
[name] Kirby is a 41 y.o. G3P2002 at [redacted]w[redacted]d  Subjective: Patient feeling increased pain with contractions. Doing well with Nubain.  Objective: BP 122/63  Pulse 64  Temp(Src) 97.6 F (36.4 C) (Oral)  Resp 18  SpO2 98%  LMP 03/27/2011     FHT:  FHR: 145 bpm, variability: moderate,  accelerations:  Present,  decelerations:  Absent UC:   regular, every 1-4 minutes SVE:   Dilation: Fingertip Effacement (%): 70 Station: -1 Exam by::  ([name] Kirby)  Labs: Lab Results  Component Value Date   WBC 7.1 01/11/2012   HGB 11.3* 01/11/2012   HCT 33.7* 01/11/2012   MCV 94.9 01/11/2012   PLT 203 01/11/2012    Assessment / Plan: Induction of labor due to postterm  Labor: Continue cervical ripening. Patient is s/p cryo; may have scar tissue. Preeclampsia:  n/a Fetal Wellbeing:  Category I Pain Control:  IV pain medication I/D:  Will being Penicillin when PItocin is started Anticipated MOD:  NSVD  [name] Kirby 01/12/2012, 7:52 AM

## 2012-01-12 NOTE — Op Note (Signed)
[name] Kirby PROCEDURE DATE: 01/11/2012 - 01/12/2012  PREOPERATIVE DIAGNOSIS: Intrauterine pregnancy at  [redacted]w[redacted]d weeks gestation; non-reassuring fetal status and non-progression of labor  POSTOPERATIVE DIAGNOSIS: The same  PROCEDURE: Primary Low Transverse Cesarean Section  SURGEON:  Dr. Scheryl Darter  ASSISTANT: Dr [name]  INDICATIONS: [name] Kirby is a 41 y.o. G3P3003 at [redacted]w[redacted]d taken for urgent cesarean section secondary to non-reassuring fetal status and non-progression of labor.  The risks of cesarean section discussed with the patient included but were not limited to: bleeding which may require transfusion or reoperation; infection which may require antibiotics; injury to bowel, bladder, ureters or other surrounding organs; injury to the fetus; need for additional procedures including hysterectomy in the event of a life-threatening hemorrhage; placental abnormalities wth subsequent pregnancies, incisional problems, thromboembolic phenomenon and other postoperative/anesthesia complications. The patient concurred with the proposed plan, giving informed written consent for the procedure.    FINDINGS:  Viable female infant in cephalic presentation.  Apgars 8 and 9, weight, 7 pounds and 15 ounces.  Clear amniotic fluid.  Intact placenta, three vessel cord.  Normal uterus, fallopian tubes and ovaries bilaterally.  ANESTHESIA:    Spinal INTRAVENOUS FLUIDS:1200 ml ESTIMATED BLOOD LOSS: 700 ml URINE OUTPUT:  75 ml SPECIMENS: Placenta sent to pathology COMPLICATIONS: None immediate  PROCEDURE IN DETAIL:  The patient received intravenous antibiotics and had sequential compression devices applied to her lower extremities while in the preoperative area.  She was then taken to the operating room where epidural anesthesia was dosed up to surgical level and was found to be adequate. She was then placed in a dorsal supine position with a leftward tilt, and prepped and  draped in a sterile manner.  A foley catheter was placed into her bladder and attached to constant gravity, which drained clear fluid throughout.  After an adequate timeout was performed, a Pfannenstiel skin incision was made with scalpel and carried through to the underlying layer of fascia. The fascia was incised in the midline and this incision was extended bilaterally using the Mayo scissors. Kocher clamps were applied to the superior aspect of the fascial incision and the underlying rectus muscles were dissected off bluntly. A similar process was carried out on the inferior aspect of the facial incision. The rectus muscles were separated in the midline bluntly and the peritoneum was entered bluntly. An Alexis retractor was placed.  Attention was turned to the lower uterine segment where a transverse hysterotomy was made with a scalpel and extended bilaterally bluntly. The infant was successfully delivered, and cord was clamped and cut and infant was handed over to awaiting neonatology team. Uterine massage was then administered and the placenta delivered intact with three-vessel cord. The uterus was then cleared of clot and debris.  The hysterotomy was closed with 0 Vicryl in a running locked fashion, and an imbricating layer was also placed with a 0 Vicryl. Overall, excellent hemostasis was noted. The abdomen and the pelvis were cleared of all clot and debris. Hemostasis was confirmed on all surfaces.  The peritoneum was reapproximated using 2-0 vicryl running stitches. The fascia was then closed using 0 Vicryl in a running fashion.  The skin was closed with 4-0 Vicryl. The patient tolerated the procedure well. Sponge, lap, instrument and needle counts were correct x 2. She was taken to the recovery room in stable condition.    [name] [name] Kirby 01/12/2012 2:27 PM

## 2012-01-12 NOTE — Transfer of Care (Signed)
Immediate Anesthesia Transfer of Care Note  Patient: [name] Kirby  Procedure(s) Performed: Procedure(s) (LRB): CESAREAN SECTION (N/A)  Patient Location: PACU  Anesthesia Type: Epidural  Level of Consciousness: awake  Airway & Oxygen Therapy: Patient Spontanous Breathing  Post-op Assessment: Report given to PACU RN  Post vital signs: Reviewed and stable  Complications: No apparent anesthesia complications

## 2012-01-12 NOTE — Anesthesia Postprocedure Evaluation (Signed)
Anesthesia Post Note  Patient: [name] Kirby  Procedure(s) Performed: Procedure(s) (LRB): CESAREAN SECTION (N/A)  Anesthesia type:sab  Patient location: PACU  Post pain: Pain level controlled  Post assessment: Post-op Vital signs reviewed  Last Vitals:  Filed Vitals:   01/12/12 1315  BP:   Pulse: 79  Temp:   Resp:     Post vital signs: Reviewed  Level of consciousness: sedated  Complications: No apparent anesthesia complications

## 2012-01-12 NOTE — Progress Notes (Signed)
[name] Kirby is a 41 y.o. G3P2002 at [redacted]w[redacted]d  Subjective: Patient doing well. Starting to feel more pressure and requesting IV pain medication  Objective: BP 135/70  Pulse 70  Temp(Src) 98.2 F (36.8 C) (Oral)  Resp 18  LMP 03/27/2011      FHT:  FHR: 140 bpm, variability: moderate,  accelerations:  Present,  decelerations:  Absent UC:   Contractions on monitor every 1-3 minutes SVE:   Dilation: Closed Effacement (%): Thick Station: -3 Exam by:: Ryann Leavitt/Carpenter,RN  Labs: Lab Results  Component Value Date   WBC 7.1 01/11/2012   HGB 11.3* 01/11/2012   HCT 33.7* 01/11/2012   MCV 94.9 01/11/2012   PLT 203 01/11/2012    Assessment / Plan: Induction of labor due to postterm. Patient has received Cytotec x1. Uterine contractions noted. Dilated to fingertip.  Labor: Not in active labor. Cervical ripening with cytotec, then foley bulb with appropriate Preeclampsia:  N/A Fetal Wellbeing:  Category I Pain Control:  Nubain I/D:  No antibiotics at this time. Will start Penicillin with Pitocin Anticipated MOD:  NSVD  Mikaele Stecher 01/12/2012, 1:17 AM

## 2012-01-12 NOTE — Consult Note (Signed)
Requested to attend this primary C/S at term gestation for non reasurring FHR and no other risk factors. At delivery infant in vertex presentation and was manually extracted with spontaneous cries and good tone. Given tactile stimulation and bulb suction of naso/oropharynx. No dysmorphic features.  Fluid was meconium stained but laryngoscopy was deferred following spontaneous cries p birth.   Care deferred to RN from [facility] and to assigned pediatrician.    Dagoberto Ligas MD [facility]

## 2012-01-12 NOTE — Progress Notes (Signed)
[name] Kirby is a 41 y.o. G3P2002 at [redacted]w[redacted]d admitted for induction of labor due to Post dates.  Subjective: Resting well after pain meds  Objective: BP 117/61  Pulse 78  Temp(Src) 98 F (36.7 C) (Oral)  Resp 20  LMP 03/27/2011      FHT:  FHR: 135 bpm, variability: moderate,  accelerations:  Present,  decelerations:  Present occasional/variable UC:   regular, every 3-4 minutes SVE:   Dilation: Closed Effacement (%): 50 Station: -2 Exam by:: S.Quillan Whitter, CNM  Labs: Lab Results  Component Value Date   WBC 7.1 01/11/2012   HGB 11.3* 01/11/2012   HCT 33.7* 01/11/2012   MCV 94.9 01/11/2012   PLT 203 01/11/2012    Assessment / Plan: Cervical Ripening in progress  Labor: 2nd cytotec placed with exam Preeclampsia:  n/a Fetal Wellbeing:  Category I Pain Control:  Nubain/Vistaril I/D:  Will start PCN with Pitocin Anticipated MOD:  NSVD  [name] E. 01/12/2012, 3:37 AM

## 2012-01-12 NOTE — Progress Notes (Signed)
[name] Kirby is a 41 y.o. G3P2002 at [redacted]w[redacted]d by LMP admitted for induction of labor due to Post dates. Due date 01/01/12.  Subjective: Comfortable with epidural  Objective: BP 101/55  Pulse 78  Temp(Src) 97.6 F (36.4 C) (Oral)  Resp 18  SpO2 100%  LMP 03/27/2011      FHT:  170, decreased variability, late decels noted UC:   regular, every 3 minutes SVE:   Dilation: Fingertip Effacement (%): 100 Station: -1 Exam by:: [name] Kirby  Labs: Lab Results  Component Value Date   WBC 7.1 01/11/2012   HGB 11.3* 01/11/2012   HCT 33.7* 01/11/2012   MCV 94.9 01/11/2012   PLT 203 01/11/2012    Assessment / Plan: Protracted latent phase  Labor: no progress, stenotic cervix Preeclampsia:  no signs or symptoms of toxicity Fetal Wellbeing:  Category II Pain Control:  Epidural I/D:  n/a Anticipated MOD:  Follow for signs of fetal compromise, consider CS if no improvement  [name] Kirby 01/12/2012, 12:47 PM

## 2012-01-12 NOTE — Anesthesia Procedure Notes (Addendum)
Epidural Patient location during procedure: OB Start time: 01/12/2012 10:17 AM  Staffing Anesthesiologist: Brayton Caves R Performed by: anesthesiologist   Preanesthetic Checklist Completed: patient identified, site marked, surgical consent, pre-op evaluation, timeout performed, IV checked, risks and benefits discussed and monitors and equipment checked  Epidural Patient position: sitting Prep: site prepped and draped and DuraPrep Patient monitoring: continuous pulse ox and blood pressure Approach: midline Injection technique: LOR air and LOR saline  Needle:  Needle type: Tuohy  Needle gauge: 17 G Needle length: 9 cm Needle insertion depth: 5 cm cm Catheter type: closed end flexible Catheter size: 19 Gauge Catheter at skin depth: 10 cm Test dose: negative  Assessment Events: blood not aspirated, injection not painful, no injection resistance, negative IV test and no paresthesia  Additional Notes Patient identified.  Risk benefits discussed including failed block, incomplete pain control, headache, nerve damage, paralysis, blood pressure changes, nausea, vomiting, reactions to medication both toxic or allergic, and postpartum back pain.  Patient expressed understanding and wished to proceed.  All questions were answered.  Sterile technique used throughout procedure and epidural site dressed with sterile barrier dressing. No paresthesia or other complications noted.The patient did not experience any signs of intravascular injection such as tinnitus or metallic taste in mouth nor signs of intrathecal spread such as rapid motor block. Please see nursing notes for vital signs.

## 2012-01-12 NOTE — Progress Notes (Signed)
Dr Adrian Blackwater notified of uternie tacysystole. Dr Adrian Blackwater notified.  Plans to leave pitocin at current rate.

## 2012-01-12 NOTE — Progress Notes (Signed)
Stinson and Debroah Loop still attempting to break scar tissue and place foley bulb

## 2012-01-12 NOTE — Progress Notes (Signed)
[name] Kirby is a 41 y.o. G3P2002 at 110w4d by LMP admitted for induction of labor due to Post dates. Due date 3/4.  Subjective:   Objective: BP 101/55  Pulse 78  Temp(Src) 97.6 F (36.4 C) (Oral)  Resp 18  SpO2 100%  LMP 03/27/2011      FHT:  FHR: 170 bpm, variability: absent,  accelerations:  Abscent,  decelerations:  Present variables deep UC:   regular, every 3 minutes SVE:   Dilation: Fingertip Effacement (%): 100 Station: -1 Exam by:: Anronld  Labs: Lab Results  Component Value Date   WBC 7.1 01/11/2012   HGB 11.3* 01/11/2012   HCT 33.7* 01/11/2012   MCV 94.9 01/11/2012   PLT 203 01/11/2012    Assessment / Plan: Protracted latent phase  Labor: no progress Preeclampsia:  no signs or symptoms of toxicity Fetal Wellbeing:  Category III Pain Control:  Epidural I/D:  Cesarean section fetal intolerance to labor, procedure and risks discussed and pt accepts Anticipated MOD:    [name] Kirby 01/12/2012, 1:19 PM

## 2012-01-12 NOTE — Progress Notes (Signed)
Dr Adrian Blackwater and Dr. Debroah Loop in attempting to break scar tissue with SSE and Clinton Sawyer.

## 2012-01-12 NOTE — Anesthesia Preprocedure Evaluation (Signed)
Anesthesia Evaluation  Patient identified by MRN, date of birth, ID band Patient awake    Reviewed: Allergy & Precautions, H&P , Patient's Chart, lab work & pertinent test results  Airway Mallampati: II TM Distance: >3 FB Neck ROM: full    Dental No notable dental hx.    Pulmonary neg pulmonary ROS,  breath sounds clear to auscultation  Pulmonary exam normal       Cardiovascular negative cardio ROS  Rhythm:regular Rate:Normal     Neuro/Psych  Headaches, PSYCHIATRIC DISORDERS negative neurological ROS  negative psych ROS   GI/Hepatic negative GI ROS, Neg liver ROS,   Endo/Other  negative endocrine ROS  Renal/GU negative Renal ROS     Musculoskeletal   Abdominal   Peds  Hematology negative hematology ROS (+)   Anesthesia Other Findings   Reproductive/Obstetrics (+) Pregnancy                           Anesthesia Physical Anesthesia Plan  ASA: II  Anesthesia Plan: Epidural   Post-op Pain Management:    Induction:   Airway Management Planned:   Additional Equipment:   Intra-op Plan:   Post-operative Plan:   Informed Consent: I have reviewed the patients History and Physical, chart, labs and discussed the procedure including the risks, benefits and alternatives for the proposed anesthesia with the patient or authorized representative who has indicated his/her understanding and acceptance.     Plan Discussed with:   Anesthesia Plan Comments:         Anesthesia Quick Evaluation  

## 2012-01-13 LAB — CBC
Hemoglobin: 9 g/dL — ABNORMAL LOW (ref 12.0–15.0)
MCH: 32.6 pg (ref 26.0–34.0)
MCV: 96 fL (ref 78.0–100.0)
Platelets: 177 10*3/uL (ref 150–400)
RBC: 2.76 MIL/uL — ABNORMAL LOW (ref 3.87–5.11)

## 2012-01-13 NOTE — Progress Notes (Signed)
Subjective: Postpartum Day #1: Cesarean Delivery Patient reports incisional pain, tolerating PO, + flatus and no problems voiding.   Out of bed and ambulating. Breast feeding with little reported difficulty. Interested in Depo for PP contraception. Circ as outpatient.  Objective: Vital signs in last 24 hours: Temp:  [97.6 F (36.4 C)-98.9 F (37.2 C)] 97.9 F (36.6 C) (03/16 0630) Pulse Rate:  [60-102] 71  (03/16 0630) Resp:  [12-20] 18  (03/16 0630) BP: (97-143)/(48-98) 102/62 mmHg (03/16 0630) SpO2:  [94 %-100 %] 97 % (03/16 0630) Weight:  [75.841 kg (167 lb 3.2 oz)] 75.841 kg (167 lb 3.2 oz) (03/15 1700)  Physical Exam:  General: alert, cooperative and mild distress Lochia: appropriate Uterine Fundus: firm Incision: healing well, no significant erythema, old dressing in place with some bleeding DVT Evaluation: No evidence of DVT seen on physical exam.   Basename 01/13/12 0527 01/11/12 2025  HGB 9.0* 11.3*  HCT 26.5* 33.7*    Assessment/Plan: Status post Cesarean section. Doing well postoperatively.  Continue current care. Will d/c SCD's since patient is ambulating. Lactation consult today. Patient is anticipating discharge on POD #3.  Celita Aron 01/13/2012, 7:31 AM

## 2012-01-13 NOTE — Anesthesia Postprocedure Evaluation (Signed)
  Anesthesia Post-op Note  Patient: [name] Kirby  Procedure(s) Performed: Procedure(s) (LRB): CESAREAN SECTION (N/A)  Patient Location: Mother/Baby  Anesthesia Type: Epidural  Level of Consciousness: awake, alert  and oriented  Airway and Oxygen Therapy: Patient Spontanous Breathing  Post-op Pain: mild  Post-op Assessment: Patient's Cardiovascular Status Stable and Respiratory Function Stable  Post-op Vital Signs: stable  Complications: No apparent anesthesia complications

## 2012-01-13 NOTE — Addendum Note (Signed)
Addendum  created 01/13/12 1558 by Earmon Phoenix, CRNA   Modules edited:Notes Section
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
## 2012-01-14 NOTE — Progress Notes (Signed)
Subjective: Postpartum Day 2: Cesarean Delivery Patient reports tolerating PO, + flatus and no problems voiding.    Objective: Vital signs in last 24 hours: Temp:  [97.4 F (36.3 C)-98.2 F (36.8 C)] 98.1 F (36.7 C) (03/17 0600) Pulse Rate:  [71-78] 76  (03/17 0600) Resp:  [18] 18  (03/17 0600) BP: (94-109)/(51-64) 106/64 mmHg (03/17 0600) SpO2:  [98 %] 98 % (03/16 1424)  Physical Exam:  General: alert, cooperative and no distress Lochia: appropriate Uterine Fundus: firm Incision: no significant drainage DVT Evaluation: No evidence of DVT seen on physical exam.   Basename 01/13/12 0527 01/11/12 2025  HGB 9.0* 11.3*  HCT 26.5* 33.7*    Assessment/Plan: Status post Cesarean section. Doing well postoperatively.  Continue current care. Anticipate discharge tomorrow.   [name] Kirby 01/14/2012, 7:23 AM

## 2012-01-15 MED ORDER — IBUPROFEN 600 MG PO TABS: 600.0000 mg | ORAL_TABLET | Freq: Four times a day (QID) | ORAL | Status: AC

## 2012-01-15 MED ORDER — OXYCODONE-ACETAMINOPHEN 5-325 MG PO TABS: 1.0000 | ORAL_TABLET | ORAL | Status: AC | PRN

## 2012-01-15 NOTE — Progress Notes (Signed)
UR chart review completed.  

## 2012-01-15 NOTE — Discharge Instructions (Signed)
Cesarean Delivery  °Cesarean delivery is the birth of a baby through a cut (incision) in the abdomen and womb (uterus).  °LET YOUR CAREGIVER KNOW ABOUT: °· Complications involving the pregnancy.  °· Allergies.  °· Medicines taken including herbs, eyedrops, over-the-counter medicines, and creams.  °· Use of steroids (by mouth or creams).  °· Previous problems with anesthetics or numbing medicine.  °· Previous surgery.  °· History of blood clots.  °· History of bleeding or blood problems.  °· Other health problems.  °RISKS AND COMPLICATIONS  °· Bleeding.  °· Infection.  °· Blood clots.  °· Injury to surrounding organs.  °· Anesthesia problems.  °· Injury to the baby.  °BEFORE THE PROCEDURE  °· A tube (Foley catheter) will be placed in your bladder. The Foley catheter drains the urine from your bladder into a bag. This keeps your bladder empty during surgery.  °· An intravenous access tube (IV) will be placed in your arm.  °· Hair may be removed from your pubic area and your lower abdomen. This is to prevent infection in the incision site.  °· You may be given an antacid medicine to drink. This will prevent acid contents in your stomach from going into your lungs if you vomit during the surgery.  °· You may be given an antibiotic medicine to prevent infection.  °PROCEDURE  °· You may be given medicine to numb the lower half of your body (regional anesthetic). If you were in labor, you may have already had an epidural in place which can be used in both labor and cesarean delivery. You may possibly be given medicine to make you sleep (general anesthetic) though this is not as common.  °· An incision will be made in your abdomen that extends to your uterus. There are 2 basic kinds of incisions:  °· The horizontal (transverse) incision. Horizontal incisions are used for most routine cesarean deliveries.  °· The vertical (up and down) incision. This is less commonly used. This is most often reserved for women who have a  serious complication (extreme prematurity) or under emergency situations.   °· The horizontal and vertical incisions may both be used at the same time. However, this is very uncommon.  °· Your baby will then be delivered.  °AFTER THE PROCEDURE  °· If you were awake during the surgery, you will see your baby right away. If you were asleep, you will see your baby as soon as you are awake.  °· You may breastfeed your baby after surgery.  °· You may be able to get up and walk the same day as the surgery. If you need to stay in bed for a period of time, you will receive help to turn, cough, and take deep breaths after surgery. This helps prevent lung problems such as pneumonia.  °· Do not get out of bed alone the first time after surgery. You will need help getting out of bed until you are able to do this by yourself.  °· You may be able to shower the day after your cesarean delivery.  After the bandage (dressing) is taken off the incision site, a nurse will assist you to shower, if you like.   °· You will have pneumatic compressing hose placed on your feet or lower legs. These hose are used to prevent blood clots. When you are up and walking regularly, they will no longer be necessary.   °· Do not cross your legs when you sit.  °· Save any blood clots that you   pass. If you pass a clot while on the toilet, do not flush it. Call for the nurse. Tell the nurse if you think you are bleeding too much or passing too many clots.  °· Start drinking liquids and eating food as directed by your caregiver. If your stomach is not ready, drinking and eating too soon can cause an increase in bloating and swelling of your intestine and abdomen. This is very uncomfortable.  °· You will be given medicine as needed. Let your caregivers know if you are hurting. They want you to be comfortable. You may also be given an antibiotic to prevent an infection.  °· Your IV will be taken out when you are drinking a reasonable amount of fluids. The  Foley catheter is taken out when you are up and walking.  °· If your blood type is Rh negative and your baby's blood type is Rh positive, you will be given a shot of anti-D immune globulin. This shot prevents you from having Rh problems with a future pregnancy. You should get the shot even if you had your tubes tied (tubal ligation).  °· If you are allowed to take the baby for a walk, place the baby in the bassinet and push it. Do not carry your baby in your arms.  °Document Released: 10/16/2005 Document Revised: 10/05/2011 Document Reviewed: 02/10/2011 °ExitCare® Patient Information ©2012 [employer]. °

## 2012-01-15 NOTE — Discharge Summary (Signed)
Obstetric Discharge Summary Reason for Admission: induction of labor Prenatal Procedures: ultrasound Intrapartum Procedures: cesarean: low cervical, transverse Postpartum Procedures: none Complications-Operative and Postpartum: none Hemoglobin  Date Value Range Status  01/13/2012 9.0* 12.0-15.0 (g/dL) Final     DELTA CHECK NOTED     REPEATED TO VERIFY     HCT  Date Value Range Status  01/13/2012 26.5* 36.0-46.0 (%) Final    Physical Exam:  General: alert, cooperative and no distress Lochia: appropriate Uterine Fundus: firm Incision: healing well, no significant drainage, no dehiscence, no significant erythema DVT Evaluation: No evidence of DVT seen on physical exam.  Discharge Diagnoses: Term Pregnancy-delivered  Discharge Information: Date: 01/15/2012 Activity: pelvic rest Diet: routine Medications: PNV, Ibuprofen and Percocet Condition: stable Instructions: refer to practice specific booklet Discharge to: home Follow-up Information    Follow up with [facility] Schedule an appointment as soon as possible for a visit in 6 weeks. (for post-partum visit)          Newborn Data: Live born female  Birth Weight: 7 lb 15.5 oz (3615 g) APGAR: 8, 9  Home with mother.  [name] Kirby 01/15/2012, 7:52 AM

## 2012-01-16 ENCOUNTER — Encounter (HOSPITAL_COMMUNITY): Payer: Self-pay | Admitting: Obstetrics & Gynecology

## 2012-11-15 ENCOUNTER — Encounter (HOSPITAL_COMMUNITY): Payer: Self-pay | Admitting: Emergency Medicine

## 2012-11-15 ENCOUNTER — Emergency Department (HOSPITAL_COMMUNITY): Payer: Self-pay

## 2012-11-15 ENCOUNTER — Emergency Department (HOSPITAL_COMMUNITY)
Admission: EM | Admit: 2012-11-15 | Discharge: 2012-11-15 | Disposition: A | Discharge status: Home / Self Care | Payer: Self-pay | Attending: Emergency Medicine | Admitting: Emergency Medicine

## 2012-11-15 DIAGNOSIS — Y92009 Unspecified place in unspecified non-institutional (private) residence as the place of occurrence of the external cause: Secondary | ICD-10-CM | POA: Insufficient documentation

## 2012-11-15 DIAGNOSIS — W2209XA Striking against other stationary object, initial encounter: Secondary | ICD-10-CM | POA: Insufficient documentation

## 2012-11-15 DIAGNOSIS — Z87891 Personal history of nicotine dependence: Secondary | ICD-10-CM | POA: Insufficient documentation

## 2012-11-15 DIAGNOSIS — Z8619 Personal history of other infectious and parasitic diseases: Secondary | ICD-10-CM | POA: Insufficient documentation

## 2012-11-15 DIAGNOSIS — Z8659 Personal history of other mental and behavioral disorders: Secondary | ICD-10-CM | POA: Insufficient documentation

## 2012-11-15 DIAGNOSIS — S92919A Unspecified fracture of unspecified toe(s), initial encounter for closed fracture: Secondary | ICD-10-CM

## 2012-11-15 DIAGNOSIS — Y9302 Activity, running: Secondary | ICD-10-CM | POA: Insufficient documentation

## 2012-11-15 DIAGNOSIS — S92309A Fracture of unspecified metatarsal bone(s), unspecified foot, initial encounter for closed fracture: Secondary | ICD-10-CM | POA: Insufficient documentation

## 2012-11-15 MED ORDER — HYDROCODONE-ACETAMINOPHEN 5-325 MG PO TABS: 1.0000 | ORAL_TABLET | Freq: Four times a day (QID) | ORAL | Status: DC | PRN

## 2012-11-15 MED ORDER — IBUPROFEN 800 MG PO TABS: 800.0000 mg | ORAL_TABLET | Freq: Three times a day (TID) | ORAL | Status: DC | PRN

## 2012-11-15 NOTE — ED Provider Notes (Signed)
History     CSN: [id number]  Arrival date & time 11/15/12  1004   First MD Initiated Contact with Patient 11/15/12 1012      Chief Complaint  Patient presents with  . Toe Pain    (Consider location/radiation/quality/duration/timing/severity/associated sxs/prior treatment) HPI The patient presents to the ER complaining of left 2nd toe pain after stubbing her toe twice on Wednesday evening. She was running into the next room and ran into the keyboard stand and then the wall, both with her left 2nd toe. She complains of toe pain, bruising, and difficulty moving her toes secondary to pain. No other injuries, no lacerations, no bleeding, normal sensation.   Past Medical History  Diagnosis Date  . Headache   . Abnormal Pap smear   . Gonorrhea   . Trichomonas   . Depression     bipolar    Past Surgical History  Procedure Date  . Eye surgery   . Gynecologic cryosurgery   . Eye surgery   . Cesarean section 01/12/2012    Procedure: CESAREAN SECTION;  Surgeon: Adam Phenix, MD;  Location: [facility];  Service: Gynecology;  Laterality: N/A;    Family History  Problem Relation Age of Onset  . Anesthesia problems Neg Hx   . Hypotension Neg Hx   . Pseudochol deficiency Neg Hx   . Malignant hyperthermia Neg Hx   . Diabetes Mother   . Hypertension Mother   . Diabetes Maternal Grandmother   . Kidney disease Maternal Grandmother     History  Substance Use Topics  . Smoking status: Former Smoker    Quit date: 01/03/2007  . Smokeless tobacco: Never Used  . Alcohol Use: No    OB History    Grav Para Term Preterm Abortions TAB SAB Ect Mult Living   3 3 3  0 0 0 0 0 0 3      Review of Systems All other systems negative except as documented in the HPI. All pertinent positives and negatives as reviewed in the HPI.  Allergies  Review of patient's allergies indicates no known allergies.  Home Medications   Current Outpatient Rx  Name  Route  Sig  Dispense  Refill  . ADULT  MULTIVITAMIN W/MINERALS CH   Oral   Take 1 tablet by mouth daily.           BP 103/68  Pulse 98  Temp 98 F (36.7 C) (Oral)  Resp 18  SpO2 99%  Physical Exam  Constitutional: She is oriented to person, place, and time. She appears well-developed and well-nourished. No distress.  HENT:  Head: Normocephalic and atraumatic.  Eyes: Pupils are equal, round, and reactive to light.  Cardiovascular: Normal rate, regular rhythm and normal heart sounds.   Pulmonary/Chest: Effort normal and breath sounds normal.  Musculoskeletal:       Left foot: She exhibits tenderness. She exhibits no swelling, normal capillary refill, no crepitus and no deformity.       Feet:  Neurological: She is alert and oriented to person, place, and time.  Skin: Skin is warm and dry. No rash noted. No erythema. No pallor.    ED Course  Procedures (including critical care time)  Patient has a small fracture at the base of her metatarsal of the second toe. Told to return here as needed. Follow up with ortho.  MDM          Carlyle Dolly, PA-C 11/15/12 1215

## 2012-11-15 NOTE — ED Notes (Signed)
Pt states on Wednesday night, she hit her foot on a piano and then again on the wall. Pt complains of pain and discoloration to 2nd toe of left foot.

## 2012-11-25 NOTE — ED Provider Notes (Signed)
Medical screening examination/treatment/procedure(s) were performed by non-physician practitioner and as supervising physician I was immediately available for consultation/collaboration.   Suzi Roots, MD 11/25/12 [phone]

## 2012-12-07 ENCOUNTER — Encounter (HOSPITAL_COMMUNITY): Payer: Self-pay | Admitting: Emergency Medicine

## 2012-12-07 ENCOUNTER — Emergency Department (HOSPITAL_COMMUNITY)
Admission: EM | Admit: 2012-12-07 | Discharge: 2012-12-07 | Disposition: A | Discharge status: Home / Self Care | Payer: Self-pay | Attending: Emergency Medicine | Admitting: Emergency Medicine

## 2012-12-07 DIAGNOSIS — Z8659 Personal history of other mental and behavioral disorders: Secondary | ICD-10-CM | POA: Insufficient documentation

## 2012-12-07 DIAGNOSIS — R110 Nausea: Secondary | ICD-10-CM | POA: Insufficient documentation

## 2012-12-07 DIAGNOSIS — Z8669 Personal history of other diseases of the nervous system and sense organs: Secondary | ICD-10-CM | POA: Insufficient documentation

## 2012-12-07 DIAGNOSIS — Z8619 Personal history of other infectious and parasitic diseases: Secondary | ICD-10-CM | POA: Insufficient documentation

## 2012-12-07 DIAGNOSIS — R6889 Other general symptoms and signs: Secondary | ICD-10-CM | POA: Insufficient documentation

## 2012-12-07 DIAGNOSIS — J069 Acute upper respiratory infection, unspecified: Secondary | ICD-10-CM | POA: Insufficient documentation

## 2012-12-07 DIAGNOSIS — J3489 Other specified disorders of nose and nasal sinuses: Secondary | ICD-10-CM | POA: Insufficient documentation

## 2012-12-07 DIAGNOSIS — R6883 Chills (without fever): Secondary | ICD-10-CM | POA: Insufficient documentation

## 2012-12-07 DIAGNOSIS — Z87891 Personal history of nicotine dependence: Secondary | ICD-10-CM | POA: Insufficient documentation

## 2012-12-07 NOTE — ED Notes (Signed)
Pt c/o productive cough that started yesterday. Pt states she has had a sore throat that started this morning, but is about gone now. Pt also reports having headaches off and on for the last 3 days. Pt states she took Tylenol yesterday. Pt denies pain at present. Pt ambulatory with steady gait to exam room.

## 2012-12-07 NOTE — ED Provider Notes (Signed)
History    This chart was scribed for non-physician practitioner working with Gerhard Munch, MD by Burman Nieves. This patient was seen in room WTR9/WTR9 and the patient's care was started at 8:14 PM.   CSN: [id number]  Arrival date & time 12/07/12  1921      No chief complaint on file.   (Consider location/radiation/quality/duration/timing/severity/associated sxs/prior treatment) Patient is a 42 y.o. female presenting with cough. The history is provided by the patient. No language interpreter was used.  Cough Cough characteristics:  Productive Sputum characteristics:  Yellow Severity:  Moderate Onset quality:  Sudden Duration:  1 day Timing:  Intermittent Progression:  Unchanged Chronicity:  New Smoker: no   Relieved by:  Nothing Worsened by:  Nothing tried Ineffective treatments:  None tried Associated symptoms: chills and rhinorrhea   Associated symptoms: no fever and no shortness of breath     [name] Kirby is a 42 y.o. female who presents to the Emergency Department complaining of moderate intermittent cough with production of yellow sputum onset yesterday. She complains of runny nose,chills, congestion, and nausea. She had a headache yesterday, but no headache today . Pt denies any history of smoking. She denies of fever, vomiting, diarrhea, SOB, weakness, and any other associated symptoms. She did not try any medications prior to arrival.     Past Medical History  Diagnosis Date  . Headache   . Abnormal Pap smear   . Gonorrhea   . Trichomonas   . Depression     bipolar    Past Surgical History  Procedure Laterality Date  . Eye surgery    . Gynecologic cryosurgery    . Eye surgery    . Cesarean section  01/12/2012    Procedure: CESAREAN SECTION;  Surgeon: Adam Phenix, MD;  Location: [facility];  Service: Gynecology;  Laterality: N/A;    Family History  Problem Relation Age of Onset  . Anesthesia problems Neg Hx   . Hypotension Neg Hx   .  Pseudochol deficiency Neg Hx   . Malignant hyperthermia Neg Hx   . Diabetes Mother   . Hypertension Mother   . Diabetes Maternal Grandmother   . Kidney disease Maternal Grandmother     History  Substance Use Topics  . Smoking status: Former Smoker    Quit date: 01/03/2007  . Smokeless tobacco: Never Used  . Alcohol Use: No    OB History   Grav Para Term Preterm Abortions TAB SAB Ect Mult Living   3 3 3  0 0 0 0 0 0 3      Review of Systems  Constitutional: Positive for chills. Negative for fever.  HENT: Positive for congestion and rhinorrhea.   Respiratory: Positive for cough. Negative for shortness of breath.   Gastrointestinal: Positive for nausea. Negative for vomiting and abdominal pain.  Neurological: Negative for weakness.  All other systems reviewed and are negative.    Allergies  Review of patient's allergies indicates no known allergies.  Home Medications   Current Outpatient Rx  Name  Route  Sig  Dispense  Refill  . HYDROcodone-acetaminophen (NORCO/VICODIN) 5-325 MG per tablet   Oral   Take 1 tablet by mouth every 6 (six) hours as needed for pain.   10 tablet   0   . ibuprofen (ADVIL,MOTRIN) 800 MG tablet   Oral   Take 1 tablet (800 mg total) by mouth every 8 (eight) hours as needed for pain.   21 tablet   0   .  Multiple Vitamin (MULTIVITAMIN WITH MINERALS) TABS   Oral   Take 1 tablet by mouth daily.           BP 131/62  Pulse 87  Temp(Src) 98.3 F (36.8 C) (Oral)  SpO2 100%  Physical Exam  Nursing note and vitals reviewed. Constitutional: She is oriented to person, place, and time. She appears well-developed and well-nourished. No distress.  HENT:  Head: Normocephalic and atraumatic.  Right Ear: External ear normal.  Left Ear: External ear normal.  anterior cervical lymphnodes are tender  No tenderness to frontal maxillary sinus   Eyes: EOM are normal.  Neck: Neck supple. No tracheal deviation present.  Cardiovascular: Normal rate,  regular rhythm and normal heart sounds.   Pulmonary/Chest: Effort normal and breath sounds normal. No respiratory distress. She has no wheezes. She has no rales.  Abdominal: Soft. She exhibits no distension.  Musculoskeletal: Normal range of motion.  Neurological: She is alert and oriented to person, place, and time.  Skin: Skin is warm and dry.  Psychiatric: She has a normal mood and affect. Her behavior is normal.    ED Course  Procedures (including critical care time) DIAGNOSTIC STUDIES: Oxygen Saturation is 99% on room air, normal by my interpretation.    COORDINATION OF CARE:\ 8:23 PM Discussed ED treatment with pt and pt agrees.       Labs Reviewed - No data to display No results found.   No diagnosis found.    MDM  Patient presenting with cough x 1 day.  Patient is afebrile.  Pulse ox 100 on RA.  Lungs CTAB.  Do not think CXR is indicated at this time.  Symptoms consistent with Viral URI.  Patient discharged home.  Return precautions discussed.  I personally performed the services described in this documentation, which was scribed in my presence. The recorded information has been reviewed and is accurate.    Pascal Lux [place], PA-C 12/07/12 2246

## 2012-12-07 NOTE — ED Provider Notes (Signed)
  Medical screening examination/treatment/procedure(s) were performed by non-physician practitioner and as supervising physician I was immediately available for consultation/collaboration.    Gerhard Munch, MD 12/07/12 2332

## 2012-12-27 ENCOUNTER — Emergency Department (HOSPITAL_COMMUNITY)
Admission: EM | Admit: 2012-12-27 | Discharge: 2012-12-27 | Disposition: A | Discharge status: Home / Self Care | Payer: Medicaid Other | Attending: Emergency Medicine | Admitting: Emergency Medicine

## 2012-12-27 ENCOUNTER — Emergency Department (HOSPITAL_COMMUNITY): Payer: Medicaid Other

## 2012-12-27 ENCOUNTER — Encounter (HOSPITAL_COMMUNITY): Payer: Self-pay | Admitting: *Deleted

## 2012-12-27 DIAGNOSIS — R05 Cough: Secondary | ICD-10-CM | POA: Insufficient documentation

## 2012-12-27 DIAGNOSIS — R51 Headache: Secondary | ICD-10-CM | POA: Insufficient documentation

## 2012-12-27 DIAGNOSIS — Z8659 Personal history of other mental and behavioral disorders: Secondary | ICD-10-CM | POA: Insufficient documentation

## 2012-12-27 DIAGNOSIS — Z8619 Personal history of other infectious and parasitic diseases: Secondary | ICD-10-CM | POA: Insufficient documentation

## 2012-12-27 DIAGNOSIS — J329 Chronic sinusitis, unspecified: Secondary | ICD-10-CM

## 2012-12-27 DIAGNOSIS — Z87891 Personal history of nicotine dependence: Secondary | ICD-10-CM | POA: Insufficient documentation

## 2012-12-27 DIAGNOSIS — R059 Cough, unspecified: Secondary | ICD-10-CM | POA: Insufficient documentation

## 2012-12-27 DIAGNOSIS — J3489 Other specified disorders of nose and nasal sinuses: Secondary | ICD-10-CM | POA: Insufficient documentation

## 2012-12-27 MED ORDER — AZITHROMYCIN 250 MG PO TABS: 250.0000 mg | ORAL_TABLET | Freq: Every day | ORAL | Status: DC

## 2012-12-27 MED ORDER — HYDROCOD POLST-CHLORPHEN POLST 10-8 MG/5ML PO LQCR: 5.0000 mL | Freq: Two times a day (BID) | ORAL | Status: DC | PRN

## 2012-12-27 NOTE — ED Notes (Signed)
Pt reports breaking two toes on left foot in January 2014. Reports toes are both swollen, denies pain. Has been on feet a lot, denies icing/elevating affected extremity.

## 2012-12-27 NOTE — ED Notes (Signed)
Pt reports onset of productive cough x2 weeks ago, was seen at ED. Has been taking otc mucinex, cough is now strong, dry, non-productive. Reports chronic headache, tender on sinuses.

## 2012-12-27 NOTE — ED Provider Notes (Signed)
History     CSN: [id number]  Arrival date & time 12/27/12  1258   First MD Initiated Contact with Patient 12/27/12 1338      Chief Complaint  Patient presents with  . Cough  . Headache    (Consider location/radiation/quality/duration/timing/severity/associated sxs/prior treatment) HPI Comments: Patient presents today with a chief complaint of cough.  Cough has been present for the past two weeks.  Initially cough was productive, but now is a dry cough.  She has been taking OTC Mucinex without relief.  Cough associated wit sinus pressure and nasal congestion.  She currently does not smoke.  Quit smoking in 2008.    Patient is a 42 y.o. female presenting with cough and headaches. The history is provided by the patient.  Cough Progression:  Worsening Context: weather changes   Context: not sick contacts   Relieved by:  Nothing Associated symptoms: headaches, rhinorrhea and sinus congestion   Associated symptoms: no chest pain, no chills, no ear pain, no fever, no shortness of breath and no wheezing   Headache Associated symptoms: congestion, cough and sinus pressure   Associated symptoms: no ear pain and no fever     Past Medical History  Diagnosis Date  . Headache   . Abnormal Pap smear   . Gonorrhea   . Trichomonas   . Depression     bipolar    Past Surgical History  Procedure Laterality Date  . Eye surgery    . Gynecologic cryosurgery    . Eye surgery    . Cesarean section  01/12/2012    Procedure: CESAREAN SECTION;  Surgeon: Adam Phenix, MD;  Location: [facility];  Service: Gynecology;  Laterality: N/A;    Family History  Problem Relation Age of Onset  . Anesthesia problems Neg Hx   . Hypotension Neg Hx   . Pseudochol deficiency Neg Hx   . Malignant hyperthermia Neg Hx   . Diabetes Mother   . Hypertension Mother   . Diabetes Maternal Grandmother   . Kidney disease Maternal Grandmother     History  Substance Use Topics  . Smoking status: Former Smoker   Quit date: 01/03/2007  . Smokeless tobacco: Never Used  . Alcohol Use: No    OB History   Grav Para Term Preterm Abortions TAB SAB Ect Mult Living   3 3 3  0 0 0 0 0 0 3      Review of Systems  Constitutional: Negative for fever and chills.  HENT: Positive for congestion, rhinorrhea and sinus pressure. Negative for ear pain.   Respiratory: Positive for cough. Negative for shortness of breath and wheezing.   Cardiovascular: Negative for chest pain.  Neurological: Positive for headaches.    Allergies  Review of patient's allergies indicates no known allergies.  Home Medications   Current Outpatient Rx  Name  Route  Sig  Dispense  Refill  . Multiple Vitamin (MULTIVITAMIN WITH MINERALS) TABS   Oral   Take 1 tablet by mouth daily.           BP 112/70  Pulse 98  Temp(Src) 97.5 F (36.4 C) (Oral)  Resp 14  Ht 5\' 4"  (1.626 m)  SpO2 100%  Physical Exam  Nursing note and vitals reviewed. Constitutional: She appears well-developed and well-nourished. No distress.  HENT:  Head: Normocephalic and atraumatic.  Right Ear: Tympanic membrane and ear canal normal.  Left Ear: Tympanic membrane and ear canal normal.  Nose: Mucosal edema and rhinorrhea present. Right sinus exhibits  maxillary sinus tenderness. Right sinus exhibits no frontal sinus tenderness. Left sinus exhibits no maxillary sinus tenderness and no frontal sinus tenderness.  Mouth/Throat: Uvula is midline, oropharynx is clear and moist and mucous membranes are normal.  Cardiovascular: Normal rate, regular rhythm and normal heart sounds.   Pulmonary/Chest: Effort normal and breath sounds normal. No respiratory distress. She has no wheezes. She has no rales.  Neurological: She is alert.  Skin: Skin is warm and dry. She is not diaphoretic.  Psychiatric: She has a normal mood and affect.    ED Course  Procedures (including critical care time)  Labs Reviewed - No data to display Dg Chest 2 View  12/27/2012   *RADIOLOGY REPORT*  Clinical Data: Persistent cough, chills  CHEST - 2 VIEW  Comparison: None.  Findings: Cardiomediastinal silhouette is unremarkable.  No acute infiltrate or pleural effusion.  No pulmonary edema.  Bony thorax is unremarkable.  IMPRESSION: No active disease.   Original Report Authenticated By: Natasha Mead, M.D.      No diagnosis found.    MDM  Patient presenting with persistent cough that has been present for the past 2 weeks.  Negative CXR.  Lungs CTAB.  No respiratory distress.  Patient also having maxillary sinus pain.  Will prescribe antibiotic and cough suppressant.  Patient instructed to follow up with PCP.  Return precautions given.        Pascal Lux [place], PA-C 12/27/12 1732

## 2012-12-31 NOTE — ED Provider Notes (Signed)
Medical screening examination/treatment/procedure(s) were performed by non-physician practitioner and as supervising physician I was immediately available for consultation/collaboration.   Juno Bozard L Scottlynn Lindell, MD 12/31/12 0145 

## 2013-10-04 ENCOUNTER — Emergency Department (HOSPITAL_COMMUNITY)
Admission: EM | Admit: 2013-10-04 | Discharge: 2013-10-04 | Disposition: A | Discharge status: Home / Self Care | Payer: Medicaid Other | Attending: Emergency Medicine | Admitting: Emergency Medicine

## 2013-10-04 ENCOUNTER — Encounter (HOSPITAL_COMMUNITY): Payer: Self-pay | Admitting: Emergency Medicine

## 2013-10-04 DIAGNOSIS — Z8619 Personal history of other infectious and parasitic diseases: Secondary | ICD-10-CM | POA: Insufficient documentation

## 2013-10-04 DIAGNOSIS — Z87891 Personal history of nicotine dependence: Secondary | ICD-10-CM | POA: Insufficient documentation

## 2013-10-04 DIAGNOSIS — R1110 Vomiting, unspecified: Secondary | ICD-10-CM | POA: Insufficient documentation

## 2013-10-04 DIAGNOSIS — J069 Acute upper respiratory infection, unspecified: Secondary | ICD-10-CM

## 2013-10-04 DIAGNOSIS — Z8659 Personal history of other mental and behavioral disorders: Secondary | ICD-10-CM | POA: Insufficient documentation

## 2013-10-04 DIAGNOSIS — H9209 Otalgia, unspecified ear: Secondary | ICD-10-CM | POA: Insufficient documentation

## 2013-10-04 MED ORDER — AZITHROMYCIN 250 MG PO TABS: 250.0000 mg | ORAL_TABLET | Freq: Every day | ORAL | Status: DC

## 2013-10-04 NOTE — ED Notes (Signed)
Pt presents with left ear ache, non-productive cough, sore throat, and nasal congestion which began one week ago. Pt vital signs are WDL and pt is in NAD.

## 2013-10-04 NOTE — ED Provider Notes (Signed)
CSN: [id number]     Arrival date & time 10/04/13  1523 History  This chart was scribed for non-physician practitioner, Raymon Mutton, PA-C,working with Suzi Roots, MD, by Karle Plumber, ED Scribe.  This patient was seen in room WTR5/WTR5 and the patient's care was started at 6:28 PM.  Chief Complaint  Patient presents with  . Otalgia  . Sore Throat  . Nasal Congestion   The history is provided by the patient. No language interpreter was used.   HPI Comments:  [name] Kirby is a 42 y.o. female who presents to the Emergency Department complaining of bilateral ear aches, sore throat, and nasal congestion for approximately one week. Pt states she has experienced rhinorrhea and a productive cough of green phlegm. She states she has had some SOB and chills. She states her child has been sick as well. She reports one episode of emesis yesterday. She denies numbness, tingling, diarrhea, headache, dizziness, fever or abdominal pain.   Past Medical History  Diagnosis Date  . Headache(784.0)   . Abnormal Pap smear   . Gonorrhea   . Trichomonas   . Depression     bipolar   Past Surgical History  Procedure Laterality Date  . Eye surgery    . Gynecologic cryosurgery    . Eye surgery    . Cesarean section  01/12/2012    Procedure: CESAREAN SECTION;  Surgeon: Adam Phenix, MD;  Location: [facility];  Service: Gynecology;  Laterality: N/A;   Family History  Problem Relation Age of Onset  . Anesthesia problems Neg Hx   . Hypotension Neg Hx   . Pseudochol deficiency Neg Hx   . Malignant hyperthermia Neg Hx   . Diabetes Mother   . Hypertension Mother   . Diabetes Maternal Grandmother   . Kidney disease Maternal Grandmother    History  Substance Use Topics  . Smoking status: Former Smoker    Quit date: 01/03/2007  . Smokeless tobacco: Never Used  . Alcohol Use: No   OB History   Grav Para Term Preterm Abortions TAB SAB Ect Mult Living   3 3 3  0 0 0 0 0 0 3      Review of Systems  Constitutional: Positive for chills. Negative for fever.  HENT: Positive for congestion, ear pain, rhinorrhea and sore throat.   Respiratory: Positive for cough.   Gastrointestinal: Positive for vomiting. Negative for diarrhea.  All other systems reviewed and are negative.    Allergies  Review of patient's allergies indicates no known allergies.  Home Medications   Current Outpatient Rx  Name  Route  Sig  Dispense  Refill  . Multiple Vitamin (MULTIVITAMIN WITH MINERALS) TABS   Oral   Take 1 tablet by mouth daily.         Marland Kitchen Phenylephrine-Pheniramine-DM (THERAFLU COLD & COUGH) 08-19-19 MG PACK   Oral   Take 1 tablet by mouth daily as needed (cold symptoms).         Marland Kitchen azithromycin (ZITHROMAX) 250 MG tablet   Oral   Take 1 tablet (250 mg total) by mouth daily. Take first 2 tablets together, then 1 every day until finished.   6 tablet   0    Triage Vitals: BP 136/90  Pulse 68  Temp(Src) 98.3 F (36.8 C) (Oral)  Resp 20  SpO2 98% Physical Exam  Nursing note reviewed. Constitutional: She is oriented to person, place, and time. She appears well-developed and well-nourished.  HENT:  Head: Normocephalic and  atraumatic.  Mouth/Throat: Oropharynx is clear and moist. No oropharyngeal exudate.  Unremarkable oral exam  Discomfort upon palpation to bilateral maxillary sinuses.   Eyes: Conjunctivae and EOM are normal. Pupils are equal, round, and reactive to light. Right eye exhibits no discharge. Left eye exhibits no discharge.  Neck: Normal range of motion. Neck supple.  Negative neck stiffness Negative nuchal rigidity Negative cervical lymphadenopathy  Cardiovascular: Normal rate, regular rhythm and normal heart sounds.  Exam reveals no friction rub.   No murmur heard. Pulses:      Radial pulses are 2+ on the right side, and 2+ on the left side.  Pulmonary/Chest: Effort normal and breath sounds normal. No respiratory distress. She has no wheezes.  She has no rales.  Musculoskeletal: Normal range of motion.  Lymphadenopathy:    She has no cervical adenopathy.  Neurological: She is alert and oriented to person, place, and time. She exhibits normal muscle tone. Coordination normal.  Cranial nerves III through XII grossly intact  Skin: Skin is warm and dry. No rash noted. No erythema.  Psychiatric: She has a normal mood and affect. Her behavior is normal.    ED Course  Procedures (including critical care time) DIAGNOSTIC STUDIES: Oxygen Saturation is 98% on RA, normal by my interpretation.   COORDINATION OF CARE: 6:32 PM- Will prescribe a Z-Pak. Advised pt to keep hydrated and return with any worsening symptoms. Pt verbalizes understanding and agrees to plan.  Medications - No data to display  Results for orders placed during the hospital encounter of 10/04/13  RAPID STREP SCREEN      Result Value Range   Streptococcus, Group A Screen (Direct) NEGATIVE  NEGATIVE    Labs Review Labs Reviewed  RAPID STREP SCREEN  CULTURE, GROUP A STREP   Imaging Review No results found.  EKG Interpretation   None       MDM   1. URI (upper respiratory infection)    Filed Vitals:   10/04/13 1542  BP: 136/90  Pulse: 68  Temp: 98.3 F (36.8 C)  TempSrc: Oral  Resp: 20  SpO2: 98%   I personally performed the services described in this documentation, which was scribed in my presence. The recorded information has been reviewed and is accurate.  Patient presenting to emergency department with nasal congestion, cough, bilateral ear pressure, facial pressure that is been ongoing for the past week. Patient reports that her son is currently getting over a viral infection. Alert and oriented. GCS 15. Heart rate and rhythm normal. Lungs clear to auscultation bilaterally to upper and lower lobes. Pulses palpable and strong, radial 2+ bilaterally. Discomfort upon palpation to the frontal aspect-frontal sinus. Unremarkable air exam  bilaterally. Unremarkable oral exam-negative swelling, erythema, exudate, petechiae noted to posterior oropharynx and tonsils bilaterally. Negative neck stiffness. Negative nuchal rigidity. Negative meningeal signs. Patient stable, afebrile. Suspicion to be upper respiratory infection, viral in nature. Patient nonseptic-appearing. Discharge patient. Discharged patient with antibiotics. Discussed with patient to rest and stay hydrated. Discussed with patient to closely monitor symptoms and if symptoms are to worsen or change to report back to emergency department-strict return instructions given. Patient agreed to plan of care, understood, all questions answered   Raymon Mutton, PA-C 10/05/13 1635

## 2013-10-06 LAB — CULTURE, GROUP A STREP

## 2013-10-07 NOTE — ED Provider Notes (Signed)
Medical screening examination/treatment/procedure(s) were performed by non-physician practitioner and as supervising physician I was immediately available for consultation/collaboration.  EKG Interpretation   None         Suzi Roots, MD 10/07/13 [phone]

## 2014-03-17 ENCOUNTER — Encounter (HOSPITAL_COMMUNITY): Payer: Self-pay | Admitting: Emergency Medicine

## 2014-03-17 ENCOUNTER — Emergency Department (HOSPITAL_COMMUNITY)
Admission: EM | Admit: 2014-03-17 | Discharge: 2014-03-17 | Disposition: A | Discharge status: Home / Self Care | Payer: Medicaid Other | Attending: Emergency Medicine | Admitting: Emergency Medicine

## 2014-03-17 DIAGNOSIS — Z87891 Personal history of nicotine dependence: Secondary | ICD-10-CM | POA: Insufficient documentation

## 2014-03-17 DIAGNOSIS — J069 Acute upper respiratory infection, unspecified: Secondary | ICD-10-CM | POA: Diagnosis not present

## 2014-03-17 DIAGNOSIS — R42 Dizziness and giddiness: Secondary | ICD-10-CM | POA: Insufficient documentation

## 2014-03-17 DIAGNOSIS — J029 Acute pharyngitis, unspecified: Secondary | ICD-10-CM | POA: Diagnosis present

## 2014-03-17 DIAGNOSIS — H9209 Otalgia, unspecified ear: Secondary | ICD-10-CM | POA: Insufficient documentation

## 2014-03-17 DIAGNOSIS — Z8619 Personal history of other infectious and parasitic diseases: Secondary | ICD-10-CM | POA: Diagnosis not present

## 2014-03-17 DIAGNOSIS — R0981 Nasal congestion: Secondary | ICD-10-CM

## 2014-03-17 DIAGNOSIS — Z8659 Personal history of other mental and behavioral disorders: Secondary | ICD-10-CM | POA: Insufficient documentation

## 2014-03-17 MED ORDER — GUAIFENESIN ER 600 MG PO TB12
600.0000 mg | ORAL_TABLET | Freq: Two times a day (BID) | ORAL | Status: DC
Start: 2014-03-17 — End: 2014-06-26

## 2014-03-17 MED ORDER — FLUTICASONE PROPIONATE 50 MCG/ACT NA SUSP: 2.0000 | Freq: Every day | NASAL | Status: DC

## 2014-03-17 NOTE — ED Notes (Signed)
Pt alert, arrives from home, c/o URI s/s, onset was yesterday morning, resp even unlabored, skin pwd

## 2014-03-17 NOTE — ED Provider Notes (Signed)
CSN: [id number]     Arrival date & time 03/17/14  1432 History  This chart was scribed for Johnnette Gourdobyn Albert PA-C,  working with Geoffery Lyonsouglas Delo, MD by Ashley JacobsBrittany Andrews, ED scribe. This patient was seen in room WTR6/WTR6 and the patient's care was started at 2:44 PM.  First MD Initiated Contact with Patient 03/17/14 1435     Chief Complaint  Patient presents with  . Sore Throat     (Consider location/radiation/quality/duration/timing/severity/associated sxs/prior Treatment) Patient is a 43 y.o. female presenting with pharyngitis. The history is provided by the patient and medical records. No language interpreter was used.  Sore Throat   HPI Comments: [name] Kirby is a 43 y.o. female who presents to the Emergency Department complaining of sore throat, onset three days ago. She has the associated symptoms of "head swimming"/ dizziness, rhinorrhea, nasal congestion, dr y cough, cold chills, fatigue, sinus pressure, weakness and bilateral ear pain. Pt did not take any medication.  Denies fever. She had sick contact with her sister who has the same symptoms.  Pt drives a school bus.  Past Medical History  Diagnosis Date  . Headache(784.0)   . Abnormal Pap smear   . Gonorrhea   . Trichomonas   . Depression     bipolar   Past Surgical History  Procedure Laterality Date  . Eye surgery    . Gynecologic cryosurgery    . Eye surgery    . Cesarean section  01/12/2012    Procedure: CESAREAN SECTION;  Surgeon: Adam PhenixJames G Arnold, MD;  Location: [facility];  Service: Gynecology;  Laterality: N/A;   Family History  Problem Relation Age of Onset  . Anesthesia problems Neg Hx   . Hypotension Neg Hx   . Pseudochol deficiency Neg Hx   . Malignant hyperthermia Neg Hx   . Diabetes Mother   . Hypertension Mother   . Diabetes Maternal Grandmother   . Kidney disease Maternal Grandmother    History  Substance Use Topics  . Smoking status: Former Smoker    Quit date: 01/03/2007  . Smokeless  tobacco: Never Used  . Alcohol Use: No   OB History   Grav Para Term Preterm Abortions TAB SAB Ect Mult Living   3 3 3  0 0 0 0 0 0 3     Review of Systems  Constitutional: Positive for chills. Negative for fever.  HENT: Positive for congestion, ear pain, rhinorrhea, sinus pressure and sore throat.   Respiratory: Positive for cough.   Neurological: Positive for dizziness and weakness.  All other systems reviewed and are negative.     Allergies  Review of patient's allergies indicates no known allergies.  Home Medications   Prior to Admission medications   Medication Sig Start Date End Date Taking? Authorizing Provider  azithromycin (ZITHROMAX) 250 MG tablet Take 1 tablet (250 mg total) by mouth daily. Take first 2 tablets together, then 1 every day until finished. 10/04/13   Marissa Sciacca, PA-C  Multiple Vitamin (MULTIVITAMIN WITH MINERALS) TABS Take 1 tablet by mouth daily.    Historical Provider, MD  Phenylephrine-Pheniramine-DM [facility](THERAFLU COLD & COUGH) 08-19-19 MG PACK Take 1 tablet by mouth daily as needed (cold symptoms).    Historical Provider, MD   BP 118/77  Pulse 97  Temp(Src) 98 F (36.7 C) (Oral)  Resp 16  Wt 130 lb (58.968 kg)  SpO2 99% Physical Exam  Nursing note and vitals reviewed. Constitutional: She is oriented to person, place, and time. She appears well-developed and  well-nourished. No distress.  HENT:  Head: Normocephalic and atraumatic.  Right Ear: Tympanic membrane normal.  Left Ear: Tympanic membrane normal.  Nose: Mucosal edema present. Right sinus exhibits frontal sinus tenderness. Left sinus exhibits frontal sinus tenderness.  Mouth/Throat: Oropharynx is clear and moist. No oropharyngeal exudate, posterior oropharyngeal edema or posterior oropharyngeal erythema.  Nasal congestion  Eyes: Conjunctivae and EOM are normal. Pupils are equal, round, and reactive to light.  Neck: Normal range of motion. Neck supple.  Cardiovascular: Normal rate,  regular rhythm and normal heart sounds.   Pulmonary/Chest: Effort normal and breath sounds normal. No respiratory distress.  Musculoskeletal: Normal range of motion. She exhibits no edema.  Lymphadenopathy:    She has no cervical adenopathy.  Neurological: She is alert and oriented to person, place, and time. No sensory deficit.  Skin: Skin is warm and dry.  Psychiatric: She has a normal mood and affect. Her behavior is normal.    ED Course  Procedures (including critical care time) DIAGNOSTIC STUDIES: Oxygen Saturation is 99% on room air, normal by my interpretation.    COORDINATION OF CARE:  2:48 PM Discussed course of care with pt . Pt understands and agrees.   Labs Review Labs Reviewed - No data to display  Imaging Review No results found.   EKG Interpretation None      MDM   Final diagnoses:  Sinus congestion  URI (upper respiratory infection)   Patient well-appearing and in no apparent distress, afebrile, vital signs stable. Treat with Flonase, nasal saline and Mucinex. Stable for discharge. Return precautions given. Patient states understanding of treatment care plan and is agreeable.   I personally performed the services described in this documentation, which was scribed in my presence. The recorded information has been reviewed and is accurate.    Trevor MaceRobyn M Albert, PA-C 03/17/14 1510

## 2014-03-17 NOTE — Discharge Instructions (Signed)
Use Flonase as prescribed. Also use nasal saline. Take Mucinex as directed. Stay well-hydrated.  Cool Mist Vaporizers Vaporizers may help relieve the symptoms of a cough and cold. They add moisture to the air, which helps mucus to become thinner and less sticky. This makes it easier to breathe and cough up secretions. Cool mist vaporizers do not cause serious burns like hot mist vaporizers ("steamers, humidifiers"). Vaporizers have not been proved to show they help with colds. You should not use a vaporizer if you are allergic to mold.  HOME CARE INSTRUCTIONS  Follow the package instructions for the vaporizer.  Do not use anything other than distilled water in the vaporizer.  Do not run the vaporizer all of the time. This can cause mold or bacteria to grow in the vaporizer.  Clean the vaporizer after each time it is used.  Clean and dry the vaporizer well before storing it.  Stop using the vaporizer if worsening respiratory symptoms develop. Document Released: 07/13/2004 Document Revised: 06/18/2013 Document Reviewed: 03/05/2013 [facility] Patient Information 2014 [place], [place].  Upper Respiratory Infection, Adult An upper respiratory infection (URI) is also sometimes known as the common cold. The upper respiratory tract includes the nose, sinuses, throat, trachea, and bronchi. Bronchi are the airways leading to the lungs. Most people improve within 1 week, but symptoms can last up to 2 weeks. A residual cough may last even longer.  CAUSES Many different viruses can infect the tissues lining the upper respiratory tract. The tissues become irritated and inflamed and often become very moist. Mucus production is also common. A cold is contagious. You can easily spread the virus to others by oral contact. This includes kissing, sharing a glass, coughing, or sneezing. Touching your mouth or nose and then touching a surface, which is then touched by another person, can also spread the  virus. SYMPTOMS  Symptoms typically develop 1 to 3 days after you come in contact with a cold virus. Symptoms vary from person to person. They may include:  Runny nose.  Sneezing.  Nasal congestion.  Sinus irritation.  Sore throat.  Loss of voice (laryngitis).  Cough.  Fatigue.  Muscle aches.  Loss of appetite.  Headache.  Low-grade fever. DIAGNOSIS  You might diagnose your own cold based on familiar symptoms, since most people get a cold 2 to 3 times a year. Your caregiver can confirm this based on your exam. Most importantly, your caregiver can check that your symptoms are not due to another disease such as strep throat, sinusitis, pneumonia, asthma, or epiglottitis. Blood tests, throat tests, and X-rays are not necessary to diagnose a common cold, but they may sometimes be helpful in excluding other more serious diseases. Your caregiver will decide if any further tests are required. RISKS AND COMPLICATIONS  You may be at risk for a more severe case of the common cold if you smoke cigarettes, have chronic heart disease (such as heart failure) or lung disease (such as asthma), or if you have a weakened immune system. The very young and very old are also at risk for more serious infections. Bacterial sinusitis, middle ear infections, and bacterial pneumonia can complicate the common cold. The common cold can worsen asthma and chronic obstructive pulmonary disease (COPD). Sometimes, these complications can require emergency medical care and may be life-threatening. PREVENTION  The best way to protect against getting a cold is to practice good hygiene. Avoid oral or hand contact with people with cold symptoms. Wash your hands often if contact occurs.  There is no clear evidence that vitamin C, vitamin E, echinacea, or exercise reduces the chance of developing a cold. However, it is always recommended to get plenty of rest and practice good nutrition. TREATMENT  Treatment is directed at  relieving symptoms. There is no cure. Antibiotics are not effective, because the infection is caused by a virus, not by bacteria. Treatment may include:  Increased fluid intake. Sports drinks offer valuable electrolytes, sugars, and fluids.  Breathing heated mist or steam (vaporizer or shower).  Eating chicken soup or other clear broths, and maintaining good nutrition.  Getting plenty of rest.  Using gargles or lozenges for comfort.  Controlling fevers with ibuprofen or acetaminophen as directed by your caregiver.  Increasing usage of your inhaler if you have asthma. Zinc gel and zinc lozenges, taken in the first 24 hours of the common cold, can shorten the duration and lessen the severity of symptoms. Pain medicines may help with fever, muscle aches, and throat pain. A variety of non-prescription medicines are available to treat congestion and runny nose. Your caregiver can make recommendations and may suggest nasal or lung inhalers for other symptoms.  HOME CARE INSTRUCTIONS   Only take over-the-counter or prescription medicines for pain, discomfort, or fever as directed by your caregiver.  Use a warm mist humidifier or inhale steam from a shower to increase air moisture. This may keep secretions moist and make it easier to breathe.  Drink enough water and fluids to keep your urine clear or pale yellow.  Rest as needed.  Return to work when your temperature has returned to normal or as your caregiver advises. You may need to stay home longer to avoid infecting others. You can also use a face mask and careful hand washing to prevent spread of the virus. SEEK MEDICAL CARE IF:   After the first few days, you feel you are getting worse rather than better.  You need your caregiver's advice about medicines to control symptoms.  You develop chills, worsening shortness of breath, or brown or red sputum. These may be signs of pneumonia.  You develop yellow or brown nasal discharge or pain  in the face, especially when you bend forward. These may be signs of sinusitis.  You develop a fever, swollen neck glands, pain with swallowing, or white areas in the back of your throat. These may be signs of strep throat. SEEK IMMEDIATE MEDICAL CARE IF:   You have a fever.  You develop severe or persistent headache, ear pain, sinus pain, or chest pain.  You develop wheezing, a prolonged cough, cough up blood, or have a change in your usual mucus (if you have chronic lung disease).  You develop sore muscles or a stiff neck. Document Released: 04/11/2001 Document Revised: 01/08/2012 Document Reviewed: 02/17/2011 [facility] Patient Information 2014 [place], [place].

## 2014-03-17 NOTE — Progress Notes (Signed)
[facility] provided pt with a list of primary care resources to help patient establish primary care.  °

## 2014-03-17 NOTE — ED Notes (Signed)
Initial Contact - pt reports URI symp, nasal congestion/cough, ear discomfort.  Speaking full/clear sentences, rr even/un-lab.  Skin PWD.  A+Ox4.  NAD.

## 2014-03-18 NOTE — ED Provider Notes (Signed)
Medical screening examination/treatment/procedure(s) were performed by non-physician practitioner and as supervising physician I was immediately available for consultation/collaboration.     Esteban Kobashigawa, MD 03/18/14 0710 

## 2014-03-18 NOTE — Progress Notes (Signed)
  CARE MANAGEMENT ED NOTE 03/18/2014  Patient:  [name] Kirby,[name] Kirby   Account Number:  [id number]  Date Initiated:  03/18/2014  Documentation initiated by:  Ferdinand CavaSCHETTINO,Tiann Saha  Subjective/Objective Assessment:   43 yo [facility]     Subjective/Objective Assessment Detail:   Patient well-appearing and in no apparent distress, afebrile, vital signs stable. Treat with Flonase, nasal saline and Mucinex. Stable for discharge. Return precautions given. Patient states understanding of treatment care plan and is agreeable.     Action/Plan:   Patient can purh[facility] an [facility] for less than her Medicaid copay   Action/Plan Detail:   Anticipated DC Date:       Status Recommendation to Physician:   Result of Recommendation:  Agreed    DC Planning Services  Medication Assistance    Choice offered to / List presented to:            Status of service:    ED Comments:   ED Comments Detail:  Patient called and spoke with this CM regarding her discharge medications. She stated that the Mucinex is not covered under Medicaid and she doesn't have the money to buy something over the counter which the pharmacist recommended. Patient requested to have a script for a cough medicine with hydrocodone and explained that this CM could not obtain a different script for the ED visit from yesterday and that [facility] and purchase guaifenesin OTC for 1.79 which is less than the Medicaid copay. Patient was appreciative of information

## 2014-06-26 ENCOUNTER — Emergency Department (HOSPITAL_COMMUNITY)
Admission: EM | Admit: 2014-06-26 | Discharge: 2014-06-27 | Disposition: A | Discharge status: Home / Self Care | Payer: Medicaid Other | Attending: Emergency Medicine | Admitting: Emergency Medicine

## 2014-06-26 ENCOUNTER — Encounter (HOSPITAL_COMMUNITY): Payer: Self-pay | Admitting: Emergency Medicine

## 2014-06-26 DIAGNOSIS — Z8619 Personal history of other infectious and parasitic diseases: Secondary | ICD-10-CM | POA: Diagnosis not present

## 2014-06-26 DIAGNOSIS — Z87891 Personal history of nicotine dependence: Secondary | ICD-10-CM | POA: Diagnosis not present

## 2014-06-26 DIAGNOSIS — H04209 Unspecified epiphora, unspecified lacrimal gland: Secondary | ICD-10-CM | POA: Diagnosis not present

## 2014-06-26 DIAGNOSIS — H5789 Other specified disorders of eye and adnexa: Secondary | ICD-10-CM | POA: Insufficient documentation

## 2014-06-26 DIAGNOSIS — H04201 Unspecified epiphora, right lacrimal gland: Secondary | ICD-10-CM

## 2014-06-26 DIAGNOSIS — Z8659 Personal history of other mental and behavioral disorders: Secondary | ICD-10-CM | POA: Diagnosis not present

## 2014-06-26 DIAGNOSIS — Z87442 Personal history of urinary calculi: Secondary | ICD-10-CM | POA: Diagnosis not present

## 2014-06-26 LAB — CBG MONITORING, ED: GLUCOSE-CAPILLARY: 97 mg/dL (ref 70–99)

## 2014-06-26 MED ORDER — FLUORESCEIN SODIUM 1 MG OP STRP
1.0000 | ORAL_STRIP | Freq: Once | OPHTHALMIC | Status: AC
  Administered 2014-06-26: 1 via OPHTHALMIC

## 2014-06-26 MED ORDER — ONDANSETRON 8 MG PO TBDP
8.0000 mg | ORAL_TABLET | Freq: Once | ORAL | Status: AC
  Administered 2014-06-26: 8 mg via ORAL
  Filled 2014-06-26: qty 1

## 2014-06-26 MED ORDER — TETRACAINE HCL 0.5 % OP SOLN
1.0000 [drp] | Freq: Once | OPHTHALMIC | Status: AC
  Administered 2014-06-26: 1 [drp] via OPHTHALMIC
  Filled 2014-06-26: qty 2

## 2014-06-26 MED ORDER — SODIUM CHLORIDE 0.9 % IV BOLUS (SEPSIS): 1000.0000 mL | Freq: Once | INTRAVENOUS | Status: DC

## 2014-06-26 MED ORDER — KETOTIFEN FUMARATE 0.025 % OP SOLN: 1.0000 [drp] | Freq: Two times a day (BID) | OPHTHALMIC | Status: DC

## 2014-06-26 MED ORDER — ONDANSETRON HCL 4 MG/2ML IJ SOLN: 4.0000 mg | INTRAMUSCULAR | Status: DC

## 2014-06-26 NOTE — ED Notes (Signed)
Pt c/o R eye drainage and itching x 3 days.

## 2014-06-26 NOTE — Discharge Instructions (Signed)
Allergic Conjunctivitis  The conjunctiva is a thin membrane that covers the visible white part of the eyeball and the underside of the eyelids. This membrane protects and lubricates the eye. The membrane has small blood vessels running through it that can normally be seen. When the conjunctiva becomes inflamed, the condition is called conjunctivitis. In response to the inflammation, the conjunctival blood vessels become swollen. The swelling results in redness in the normally white part of the eye.  The blood vessels of this membrane also react when a person has allergies and is then called allergic conjunctivitis. This condition usually lasts for as long as the allergy persists. Allergic conjunctivitis cannot be passed to another person (non-contagious). The likelihood of bacterial infection is great and the cause is not likely due to allergies if the inflamed eye has:  · A sticky discharge.  · Discharge or sticking together of the lids in the morning.  · Scaling or flaking of the eyelids where the eyelashes come out.  · Red swollen eyelids.  CAUSES   · Viruses.  · Irritants such as foreign bodies.  · Chemicals.  · General allergic reactions.  · Inflammation or serious diseases in the inside or the outside of the eye or the orbit (the boney cavity in which the eye sits) can cause a "red eye."  SYMPTOMS   · Eye redness.  · Tearing.  · Itchy eyes.  · Burning feeling in the eyes.  · Clear drainage from the eye.  · Allergic reaction due to pollens or ragweed sensitivity. Seasonal allergic conjunctivitis is frequent in the spring when pollens are in the air and in the fall.  DIAGNOSIS   This condition, in its many forms, is usually diagnosed based on the history and an ophthalmological exam. It usually involves both eyes. If your eyes react at the same time every year, allergies may be the cause. While most "red eyes" are due to allergy or an infection, the role of an eye (ophthalmological) exam is important. The exam  can rule out serious diseases of the eye or orbit.  TREATMENT   · Non-antibiotic eye drops, ointments, or medications by mouth may be prescribed if the ophthalmologist is sure the conjunctivitis is due to allergies alone.  · Over-the-counter drops and ointments for allergic symptoms should be used only after other causes of conjunctivitis have been ruled out, or as your caregiver suggests.  Medications by mouth are often prescribed if other allergy-related symptoms are present. If the ophthalmologist is sure that the conjunctivitis is due to allergies alone, treatment is normally limited to drops or ointments to reduce itching and burning.  HOME CARE INSTRUCTIONS   · Wash hands before and after applying drops or ointments, or touching the inflamed eye(s) or eyelids.  · Do not let the eye dropper tip or ointment tube touch the eyelid when putting medicine in your eye.  · Stop using your soft contact lenses and throw them away. Use a new pair of lenses when recovery is complete. You should run through sterilizing cycles at least three times before use after complete recovery if the old soft contact lenses are to be used. Hard contact lenses should be stopped. They need to be thoroughly sterilized before use after recovery.  · Itching and burning eyes due to allergies is often relieved by using a cool cloth applied to closed eye(s).  SEEK MEDICAL CARE IF:   · Your problems do not go away after two or three days of treatment.  ·   Your lids are sticky (especially in the morning when you wake up) or stick together.  · Discharge develops. Antibiotics may be needed either as drops, ointment, or by mouth.  · You have extreme light sensitivity.  · An oral temperature above 102° F (38.9° C) develops.  · Pain in or around the eye or any other visual symptom develops.  MAKE SURE YOU:   · Understand these instructions.  · Will watch your condition.  · Will get help right away if you are not doing well or get worse.  Document  Released: 01/06/2003 Document Revised: 01/08/2012 Document Reviewed: 12/02/2007  ExitCare® Patient Information ©2015 Exit[employer], LLC. This information is not intended to replace advice given to you by your health care provider. Make sure you discuss any questions you have with your health care provider.

## 2014-06-26 NOTE — ED Notes (Signed)
Bed: [facility] Expected date:  Expected time:  Means of arrival:  Comments: Hold 

## 2014-06-26 NOTE — ED Provider Notes (Signed)
CSN: [id number]     Arrival date & time 06/26/14  2055 History  This chart was scribed for non-physician practitioner, Antony Madura, PA-C working with Layla Maw Ward, DO by Luisa Dago, ED scribe. This patient was seen in room WTR7/WTR7 and the patient's care was started at 10:18 PM.    Chief Complaint  Patient presents with  . Eye Drainage   The history is provided by the patient. No language interpreter was used.   HPI Comments: [name] Kirby is a 43 y.o. female who presents to the Emergency Department complaining of right eye drainage that started approximately 3 days ago. Pt is also complaining of associated itching that also started 3 days ago. She states that the drainage is clear in nature. Pt states that she is unsure if the eye drainage is caused by the recent ingestion of fruit or facial cream change (ocean breeze; has stopped usage). She describes the pain as "throbbing" in nature and is complaining of mild swelling to her bottom lid of the affected eye. Pt reports taking Advil. She also reports a hx of eye surgery to the left eye. Left eye looks swollen but this is baseline. Denies any eye redness, nasal congestion, rhinorrhea, fever, or eye swelling.   Past Medical History  Diagnosis Date  . Headache(784.0)   . Abnormal Pap smear   . Gonorrhea   . Trichomonas   . Depression     bipolar  . Kidney stones    Past Surgical History  Procedure Laterality Date  . Eye surgery    . Gynecologic cryosurgery    . Eye surgery    . Cesarean section  01/12/2012    Procedure: CESAREAN SECTION;  Surgeon: Adam Phenix, MD;  Location: [facility];  Service: Gynecology;  Laterality: N/A;   Family History  Problem Relation Age of Onset  . Anesthesia problems Neg Hx   . Hypotension Neg Hx   . Pseudochol deficiency Neg Hx   . Malignant hyperthermia Neg Hx   . Diabetes Mother   . Hypertension Mother   . Diabetes Maternal Grandmother   . Kidney disease Maternal Grandmother     History  Substance Use Topics  . Smoking status: Former Smoker    Quit date: 01/03/2007  . Smokeless tobacco: Never Used  . Alcohol Use: No   OB History   Grav Para Term Preterm Abortions TAB SAB Ect Mult Living   0 0 0 0 0 0 3     Review of Systems  Eyes: Positive for discharge and itching.  All other systems reviewed and are negative.  Allergies  Review of patient's allergies indicates no known allergies.  Home Medications   Prior to Admission medications   Medication Sig Start Date End Date Taking? Authorizing Provider  ketotifen (ZADITOR) 0.025 % ophthalmic solution Place 1 drop into the right eye 2 (two) times daily. 06/26/14   Antony Madura, PA-C   Triage Vitals: BP 112/75  Pulse 90  Temp(Src) 98.3 F (36.8 C) (Oral)  Resp 20  Ht  (1.6 m)  Wt 130 lb (58.968 kg)  BMI 23.03 kg/m2  SpO2 100%  LMP 04/29/2014  Physical Exam  Nursing note and vitals reviewed. Constitutional: She is oriented to person, place, and time. She appears well-developed and well-nourished. No distress.  Nontoxic/nonseptic appearing  HENT:  Head: Normocephalic and atraumatic.  Eyes: Conjunctivae and EOM are normal. Pupils are equal, round, and reactive to light. Right eye exhibits discharge (mild  watery discharge). No scleral icterus.  Red reflex intact to right eye. No proptosis or hyphema. No conjunctival injection or subconjunctival hemorrhage. No evidence of foreign body. Fluorescein staining without uptake; no evidence of corneal abrasion or ulcer. No dendritic staining. IOP of right eye 18 with 95% CI. EOMs normal without nystagmus.  Neck: Normal range of motion.  Pulmonary/Chest: Effort normal. No respiratory distress.  Chest expansion symmetric  Musculoskeletal: Normal range of motion.  Neurological: She is alert and oriented to person, place, and time. She exhibits normal muscle tone. Coordination normal.  GCS 15. Patient was extremities without ataxia. She ambulates with  normal gait.  Skin: Skin is warm and dry. No rash noted. She is not diaphoretic. No erythema. No pallor.  Psychiatric: She has a normal mood and affect. Her behavior is normal.    ED Course  Procedures (including critical care time)  DIAGNOSTIC STUDIES: Oxygen Saturation is 100% on RA, normal by my interpretation.    COORDINATION OF CARE: 10:23 PM- Pt advised of plan for treatment and pt agrees.  Labs Review Labs Reviewed  CBG MONITORING, ED    Imaging Review No results found.   EKG Interpretation   Date/Time:  Friday June 26 2014 23:19:00 EDT Ventricular Rate:  82 PR Interval:  162 QRS Duration: 89 QT Interval:  385 QTC Calculation: 450 R Axis:   67 Text Interpretation:  Sinus rhythm Right atrial enlargement RSR' in V1 or  V2, probably normal variant ST elev, probable normal early repol pattern  ED PHYSICIAN INTERPRETATION AVAILABLE IN [facility] Confirmed by  TEST, Record ([id number]) on 06/28/2014 8:43:52 AM      MDM   Final diagnoses:  Eye tearing, right    2200 - Patient presents for tearing to right eye. She is well and nontoxic appearing, hemodynamically stable, and afebrile. Physical exam today significant only for small amount of clear, watery discharge from right eye. Fluorescein staining normal and without uptake. Intraocular pressure also normal. Suspect that symptoms are secondary to seasonal allergies. Will prescribe Zaditor drops for symptom improvement. Have advised patient followup with her primary care provider should symptoms persist. Return precautions provided and patient agreeable to plan with no unaddressed concerns.  2330 - Notified by nurse that patient became lightheaded and hypotensive just prior to discharge. She became nauseous and experienced 1 episode of NB/NB emesis. Blood pressure 70/42. This has now improved to 99/52 after rest. Suspect vasovagal response. Will monitor in ED for improvement.  0025 - Patient continues to improve with  no recurrence in symptoms. No dizziness/lightheadedness, chest pain, or SOB at present. Patient able to ambulate independently. Believe she is stable and appropriate for discharge at this time. Return precautions provided. Patient discharged in good condition.  I personally performed the services described in this documentation, which was scribed in my presence. The recorded information has been reviewed and is accurate.    Antony Madura, PA-C 06/29/14 [phone]

## 2014-06-26 NOTE — ED Notes (Signed)
Pt c/o acute onset of dizziness, BP found to be hypotensive - EKG and CBG obtained - pt also w/ x1 episode of n/v. Antony Madura, PAC and Dr. Denton Lank made ware.

## 2014-06-27 NOTE — ED Notes (Signed)
Pt ambulating independently w/ steady gait on d/c in no acute distress, A&Ox4. D/c instructions reviewed w/ pt and family - pt and family deny any further questions or concerns at present. Rx given x1  

## 2014-06-27 NOTE — ED Notes (Signed)
Pt ambulated w/ stand-by assistance - denies any dizziness.

## 2014-06-29 NOTE — ED Provider Notes (Signed)
Medical screening examination/treatment/procedure(s) were performed by non-physician practitioner and as supervising physician I was immediately available for consultation/collaboration.   EKG Interpretation   Date/Time:  Friday June 26 2014 23:19:00 EDT Ventricular Rate:  82 PR Interval:  162 QRS Duration: 89 QT Interval:  385 QTC Calculation: 450 R Axis:   67 Text Interpretation:  Sinus rhythm Right atrial enlargement RSR' in V1 or  V2, probably normal variant ST elev, probable normal early repol pattern  ED PHYSICIAN INTERPRETATION AVAILABLE IN [facility] Confirmed by  TEST, Record ([id number]) on 06/28/2014 8:43:52 AM        Layla Maw Ward, DO 06/29/14 1538

## 2014-08-31 ENCOUNTER — Encounter (HOSPITAL_COMMUNITY): Payer: Self-pay | Admitting: Emergency Medicine

## 2015-01-11 ENCOUNTER — Encounter (HOSPITAL_COMMUNITY): Payer: Self-pay | Admitting: Emergency Medicine

## 2015-01-11 ENCOUNTER — Emergency Department (HOSPITAL_COMMUNITY)
Admission: EM | Admit: 2015-01-11 | Discharge: 2015-01-11 | Disposition: A | Discharge status: Home / Self Care | Payer: Medicaid Other | Attending: Emergency Medicine | Admitting: Emergency Medicine

## 2015-01-11 DIAGNOSIS — Z87891 Personal history of nicotine dependence: Secondary | ICD-10-CM | POA: Diagnosis not present

## 2015-01-11 DIAGNOSIS — Z8619 Personal history of other infectious and parasitic diseases: Secondary | ICD-10-CM | POA: Diagnosis not present

## 2015-01-11 DIAGNOSIS — M545 Low back pain, unspecified: Secondary | ICD-10-CM

## 2015-01-11 DIAGNOSIS — Z8659 Personal history of other mental and behavioral disorders: Secondary | ICD-10-CM | POA: Insufficient documentation

## 2015-01-11 DIAGNOSIS — Z87442 Personal history of urinary calculi: Secondary | ICD-10-CM | POA: Insufficient documentation

## 2015-01-11 DIAGNOSIS — Z791 Long term (current) use of non-steroidal anti-inflammatories (NSAID): Secondary | ICD-10-CM | POA: Diagnosis not present

## 2015-01-11 MED ORDER — KETOROLAC TROMETHAMINE 60 MG/2ML IM SOLN
60.0000 mg | Freq: Once | INTRAMUSCULAR | Status: AC
  Administered 2015-01-11: 60 mg via INTRAMUSCULAR
  Filled 2015-01-11: qty 2

## 2015-01-11 MED ORDER — DIAZEPAM 5 MG PO TABS
5.0000 mg | ORAL_TABLET | Freq: Once | ORAL | Status: AC
  Administered 2015-01-11: 5 mg via ORAL
  Filled 2015-01-11: qty 1

## 2015-01-11 MED ORDER — HYDROCODONE-ACETAMINOPHEN 5-325 MG PO TABS: 2.0000 | ORAL_TABLET | ORAL | Status: DC | PRN

## 2015-01-11 MED ORDER — NAPROXEN 500 MG PO TABS: 500.0000 mg | ORAL_TABLET | Freq: Two times a day (BID) | ORAL | Status: DC

## 2015-01-11 NOTE — ED Provider Notes (Signed)
CSN: [id number]     Arrival date & time 01/11/15  [id number] History  This chart was scribed for Joycie PeekBenjamin Soraya Paquette, PA-C working with No att. providers found by Elveria Risingimelie Horne, ED Scribe. This patient was seen in room WTR8/WTR8 and the patient's care was started at 7:41 PM.   Chief Complaint  Patient presents with  . Back Pain   The history is provided by the patient. No language interpreter was used.   HPI Comments: [name] Kirby is a 44 y.o. female who presents to the Emergency Department complaining of acute onset right lower back pain after leaving church yesterday. Patient reports onset of pain after arriving to the grocery store and bending over to get her child out of the car. Patient reports pain especially with ambulation and bending. Patient reports sharp, shooting pain that radiates anteriorly with bending over. Patient reports treatment with ibuprofen, without relief. Patient reports 6/10 pain at rest/sitting and 9/10 pain with movement. Patient reports history of kidney stones and gall stones. Denies numbness, weakness, fevers, chills.   Past Medical History  Diagnosis Date  . Headache(784.0)   . Abnormal Pap smear   . Gonorrhea   . Trichomonas   . Depression     bipolar  . Kidney stones    Past Surgical History  Procedure Laterality Date  . Eye surgery    . Gynecologic cryosurgery    . Eye surgery    . Cesarean section  01/12/2012    Procedure: CESAREAN SECTION;  Surgeon: Adam PhenixJames G Arnold, MD;  Location: [facility];  Service: Gynecology;  Laterality: N/A;   Family History  Problem Relation Age of Onset  . Anesthesia problems Neg Hx   . Hypotension Neg Hx   . Pseudochol deficiency Neg Hx   . Malignant hyperthermia Neg Hx   . Diabetes Mother   . Hypertension Mother   . Diabetes Maternal Grandmother   . Kidney disease Maternal Grandmother    History  Substance Use Topics  . Smoking status: Former Smoker    Quit date: 01/03/2007  . Smokeless tobacco: Never Used   . Alcohol Use: No   OB History    Gravida Para Term Preterm AB TAB SAB Ectopic Multiple Living   3 3 3  0 0 0 0 0 0 3     Review of Systems  Constitutional: Negative for fever and chills.  Gastrointestinal: Negative for nausea, vomiting and abdominal pain.  Genitourinary: Negative for dysuria and hematuria.  Musculoskeletal: Positive for back pain.  Neurological: Negative for weakness and numbness.    Allergies  Review of patient's allergies indicates no known allergies.  Home Medications   Prior to Admission medications   Medication Sig Start Date End Date Taking? Authorizing Provider  HYDROcodone-acetaminophen (NORCO) 5-325 MG per tablet Take 2 tablets by mouth every 4 (four) hours as needed. 01/11/15   Joycie PeekBenjamin Caresse Sedivy, PA-C  ketotifen (ZADITOR) 0.025 % ophthalmic solution Place 1 drop into the right eye 2 (two) times daily. 06/26/14   Antony MaduraKelly Humes, PA-C  naproxen (NAPROSYN) 500 MG tablet Take 1 tablet (500 mg total) by mouth 2 (two) times daily. 01/11/15   Joycie PeekBenjamin Kimanh Templeman, PA-C   Triage Vitals: BP 125/80 mmHg  Pulse 71  Temp(Src) 98.3 F (36.8 C) (Oral)  Resp 18  SpO2 100% Physical Exam  Constitutional: She is oriented to person, place, and time. She appears well-developed and well-nourished. No distress.  HENT:  Head: Normocephalic and atraumatic.  Eyes: EOM are normal. Right eye exhibits no discharge.  Left eye exhibits no discharge.  Neck: Neck supple. No tracheal deviation present.  Cardiovascular: Normal rate, regular rhythm and normal heart sounds.   Pulmonary/Chest: Effort normal. No respiratory distress.  Abdominal: Soft. There is no tenderness.  Musculoskeletal: Normal range of motion.  Tenderness over right SI joint. No overt midline bony tenderness. Full ROM of cervical, thoracic, and lumbar spine. No bony stepoffs. No crepitus. No other lesions or gross deformities noted.   Neurological: She is alert and oriented to person, place, and time.  Motor and  sensation 5/5 in all four extremities. Gait mildly antalgia with no apparent ataxia.   Skin: Skin is warm and dry.  Psychiatric: She has a normal mood and affect. Her behavior is normal.  Nursing note and vitals reviewed.   ED Course  Procedures (including critical care time)  COORDINATION OF CARE: 7:52 PM- Patient advised to continue taking anti inflammatories and to treat with heat therapy at home. Discussed treatment plan with patient at bedside and patient agreed to plan.   Labs Review Labs Reviewed - No data to display  Imaging Review No results found.   EKG Interpretation None     Meds given in ED:  Medications  ketorolac (TORADOL) injection 60 mg (60 mg Intramuscular Given 01/11/15 1958)  diazepam (VALIUM) tablet 5 mg (5 mg Oral Given 01/11/15 2000)    Discharge Medication List as of 01/11/2015  7:56 PM    START taking these medications   Details  HYDROcodone-acetaminophen (NORCO) 5-325 MG per tablet Take 2 tablets by mouth every 4 (four) hours as needed., Starting 01/11/2015, Until Discontinued, Print    naproxen (NAPROSYN) 500 MG tablet Take 1 tablet (500 mg total) by mouth 2 (two) times daily., Starting 01/11/2015, Until Discontinued, Print       Filed Vitals:   01/11/15 1900  BP: 125/80  Pulse: 71  Temp: 98.3 F (36.8 C)  TempSrc: Oral  Resp: 18  SpO2: 100%    MDM  Vitals stable - WNL -afebrile Pt resting comfortably in ED. PE--tenderness over right SI joint. Normal neuro exam. Gait antalgic with no ataxia.  DDX--patient with likely musculoskeletal strain. Will treat conservatively with anti-inflammatories and short course pain medicines. Discussed further symptomatic relief with warm towel compresses and range of motion exercises. Exam not concerning for cauda equina, conus medullaris syndrome, other cord impingement, vascular compromise or other acute or emergent pathology.  I discussed all relevant lab findings and imaging results with pt and they  verbalized understanding. Discussed f/u with PCP within 48 hrs and return precautions, pt very amenable to plan.  Final diagnoses:  Right-sided low back pain without sciatica   I personally performed the services described in this documentation, which was scribed in my presence. The recorded information has been reviewed and is accurate.    Joycie Peek, PA-C 01/12/15 1019  Linwood Dibbles, MD 01/13/15 [phone]

## 2015-01-11 NOTE — Discharge Instructions (Signed)
Back Pain, Adult Back pain is very common. The pain often gets better over time. The cause of back pain is usually not dangerous. Most people can learn to manage their back pain on their own.  HOME CARE   Stay active. Start with short walks on flat ground if you can. Try to walk farther each day.  Do not sit, drive, or stand in one place for more than 30 minutes. Do not stay in bed.  Do not avoid exercise or work. Activity can help your back heal faster.  Be careful when you bend or lift an object. Bend at your knees, keep the object close to you, and do not twist.  Sleep on a firm mattress. Lie on your side, and bend your knees. If you lie on your back, put a pillow under your knees.  Only take medicines as told by your doctor.  Put ice on the injured area.  Put ice in a plastic bag.  Place a towel between your skin and the bag.  Leave the ice on for 15-20 minutes, 03-04 times a day for the first 2 to 3 days. After that, you can switch between ice and heat packs.  Ask your doctor about back exercises or massage.  Avoid feeling anxious or stressed. Find good ways to deal with stress, such as exercise. GET HELP RIGHT AWAY IF:   Your pain does not go away with rest or medicine.  Your pain does not go away in 1 week.  You have new problems.  You do not feel well.  The pain spreads into your legs.  You cannot control when you poop (bowel movement) or pee (urinate).  Your arms or legs feel weak or lose feeling (numbness).  You feel sick to your stomach (nauseous) or throw up (vomit).  You have belly (abdominal) pain.  You feel like you may pass out (faint). MAKE SURE YOU:   Understand these instructions.  Will watch your condition.  Will get help right away if you are not doing well or get worse. Document Released: 04/03/2008 Document Revised: 01/08/2012 Document Reviewed: 02/17/2014 [facility] Patient Information 2015 [place], [place]. This information is not intended  to replace advice given to you by your health care provider. Make sure you discuss any questions you have with your health care provider.  Please take your pain medicines as prescribed. Do not drive or operate machinery while taking her pain medicines. Please follow-up with primary care/[facility] for further evaluation and management of your symptoms. Return to ED for new or worsening symptoms.

## 2015-01-11 NOTE — ED Notes (Signed)
Per pt, states she tweaked back picking up child yesterday-no dysuria -right lower back pain

## 2015-05-01 ENCOUNTER — Encounter (HOSPITAL_COMMUNITY): Payer: Self-pay | Admitting: Emergency Medicine

## 2015-05-01 ENCOUNTER — Emergency Department (HOSPITAL_COMMUNITY)
Admission: EM | Admit: 2015-05-01 | Discharge: 2015-05-01 | Disposition: A | Discharge status: Home / Self Care | Payer: Medicaid Other | Attending: Emergency Medicine | Admitting: Emergency Medicine

## 2015-05-01 DIAGNOSIS — Z8659 Personal history of other mental and behavioral disorders: Secondary | ICD-10-CM | POA: Diagnosis not present

## 2015-05-01 DIAGNOSIS — Z87891 Personal history of nicotine dependence: Secondary | ICD-10-CM | POA: Insufficient documentation

## 2015-05-01 DIAGNOSIS — Z8619 Personal history of other infectious and parasitic diseases: Secondary | ICD-10-CM | POA: Diagnosis not present

## 2015-05-01 DIAGNOSIS — R21 Rash and other nonspecific skin eruption: Secondary | ICD-10-CM | POA: Diagnosis present

## 2015-05-01 DIAGNOSIS — Z87442 Personal history of urinary calculi: Secondary | ICD-10-CM | POA: Diagnosis not present

## 2015-05-01 DIAGNOSIS — L309 Dermatitis, unspecified: Secondary | ICD-10-CM | POA: Insufficient documentation

## 2015-05-01 MED ORDER — TRIAMCINOLONE ACETONIDE 0.1 % EX CREA: 1.0000 "application " | TOPICAL_CREAM | Freq: Two times a day (BID) | CUTANEOUS | Status: DC

## 2015-05-01 NOTE — ED Provider Notes (Signed)
CSN: [id number]     Arrival date & time 05/01/15  0908 History   First MD Initiated Contact with Patient 05/01/15 [phone]     Chief Complaint  Patient presents with  . Rash     (Consider location/radiation/quality/duration/timing/severity/associated sxs/prior Treatment) HPI..... Patient complains of rash on back and right forearm.  She has tried no new medications at home. Initially, rash was pruritic, but less so now. Severity is mild. No fever, sweats, chills. Patient states a past history of eczema  Past Medical History  Diagnosis Date  . Headache(784.0)   . Abnormal Pap smear   . Gonorrhea   . Trichomonas   . Depression     bipolar  . Kidney stones    Past Surgical History  Procedure Laterality Date  . Eye surgery    . Gynecologic cryosurgery    . Eye surgery    . Cesarean section  01/12/2012    Procedure: CESAREAN SECTION;  Surgeon: Adam Phenix, MD;  Location: [facility];  Service: Gynecology;  Laterality: N/A;   Family History  Problem Relation Age of Onset  . Anesthesia problems Neg Hx   . Hypotension Neg Hx   . Pseudochol deficiency Neg Hx   . Malignant hyperthermia Neg Hx   . Diabetes Mother   . Hypertension Mother   . Diabetes Maternal Grandmother   . Kidney disease Maternal Grandmother    History  Substance Use Topics  . Smoking status: Former Smoker    Quit date: 01/03/2007  . Smokeless tobacco: Never Used  . Alcohol Use: No   OB History    Gravida Para Term Preterm AB TAB SAB Ectopic Multiple Living   0 0 0 0 0 0 3     Review of Systems  All other systems reviewed and are negative.     Allergies  Review of patient's allergies indicates no known allergies.  Home Medications   Prior to Admission medications   Medication Sig Start Date End Date Taking? Authorizing Provider  ibuprofen (ADVIL,MOTRIN) 200 MG tablet Take 400 mg by mouth every 6 (six) hours as needed for headache.   Yes Historical Provider, MD  HYDROcodone-acetaminophen (NORCO)  5-325 MG per tablet Take 2 tablets by mouth every 4 (four) hours as needed. Patient not taking: Reported on 05/01/2015 01/11/15   Joycie Peek, PA-C  ketotifen (ZADITOR) 0.025 % ophthalmic solution Place 1 drop into the right eye 2 (two) times daily. Patient not taking: Reported on 05/01/2015 06/26/14   Antony Madura, PA-C  naproxen (NAPROSYN) 500 MG tablet Take 1 tablet (500 mg total) by mouth 2 (two) times daily. Patient not taking: Reported on 05/01/2015 01/11/15   Joycie Peek, PA-C  triamcinolone cream (KENALOG) 0.1 % Apply 1 application topically 2 (two) times daily. 05/01/15   Donnetta Hutching, MD   BP 115/70 mmHg  Pulse 70  Temp(Src) 97.8 F (36.6 C) (Oral)  Resp 18  Ht  (1.6 m)  Wt 125 lb (56.7 kg)  BMI 22.15 kg/m2  SpO2 100%  LMP 04/01/2015 (Exact Date) Physical Exam  Constitutional: She is oriented to person, place, and time. She appears well-developed and well-nourished.  HENT:  Head: Normocephalic and atraumatic.  Eyes: Conjunctivae and EOM are normal. Pupils are equal, round, and reactive to light.  Musculoskeletal: Normal range of motion.  Neurological: She is alert and oriented to person, place, and time.  Skin:  Minor papular rash on left upper back and dorsum of left forearm. No evidence of  scabies  Psychiatric: She has a normal mood and affect. Her behavior is normal.  Nursing note and vitals reviewed.   ED Course  Procedures (including critical care time) Labs Review Labs Reviewed - No data to display  Imaging Review No results found.   EKG Interpretation None      MDM   Final diagnoses:  Eczema    Skin rash does not appear to be scabies. I suspect eczema of uncertain etiology. Rx triamcinolone 0.1% cream. Patient is nontoxic-appearing    Donnetta HutchingBrian An Lannan, MD 05/01/15 1125

## 2015-05-01 NOTE — ED Notes (Signed)
Pt reported having a rash to upper lt back and RFA s/p to getting hair done. Pt reported having itching at that time but has resolved.

## 2015-05-01 NOTE — Discharge Instructions (Signed)
Apply ointment twice a day to rash. Return if worse

## 2015-08-10 ENCOUNTER — Emergency Department (HOSPITAL_COMMUNITY)
Admission: EM | Admit: 2015-08-10 | Discharge: 2015-08-10 | Disposition: A | Discharge status: Home / Self Care | Payer: Medicaid Other | Attending: Emergency Medicine | Admitting: Emergency Medicine

## 2015-08-10 ENCOUNTER — Encounter (HOSPITAL_COMMUNITY): Payer: Self-pay | Admitting: Emergency Medicine

## 2015-08-10 DIAGNOSIS — R21 Rash and other nonspecific skin eruption: Secondary | ICD-10-CM | POA: Diagnosis present

## 2015-08-10 DIAGNOSIS — L309 Dermatitis, unspecified: Secondary | ICD-10-CM | POA: Insufficient documentation

## 2015-08-10 DIAGNOSIS — Z79899 Other long term (current) drug therapy: Secondary | ICD-10-CM | POA: Insufficient documentation

## 2015-08-10 DIAGNOSIS — Z87442 Personal history of urinary calculi: Secondary | ICD-10-CM | POA: Diagnosis not present

## 2015-08-10 DIAGNOSIS — Z87891 Personal history of nicotine dependence: Secondary | ICD-10-CM | POA: Insufficient documentation

## 2015-08-10 DIAGNOSIS — Z8659 Personal history of other mental and behavioral disorders: Secondary | ICD-10-CM | POA: Insufficient documentation

## 2015-08-10 DIAGNOSIS — Z8619 Personal history of other infectious and parasitic diseases: Secondary | ICD-10-CM | POA: Insufficient documentation

## 2015-08-10 MED ORDER — DESONIDE 0.05 % EX CREA: TOPICAL_CREAM | Freq: Two times a day (BID) | CUTANEOUS | Status: DC

## 2015-08-10 NOTE — Discharge Instructions (Signed)
Use desonide ointment twice daily for 2 weeks. Also continue to use and minimally in such as Eucerin daily for dry skin. Least follow up with your primary care provider in the next week. Return to the emergency department if symptoms worsen.

## 2015-08-10 NOTE — ED Notes (Signed)
Pt states she believes she has scabies.  Rash x 1 month.

## 2015-08-10 NOTE — ED Provider Notes (Signed)
CSN: [id number]     Arrival date & time 08/10/15  1702 History  By signing my name below, I, Octavia Heir, attest that this documentation has been prepared under the direction and in the presence of Melburn Hake, PA-C. Electronically Signed: Octavia Heir, ED Scribe. 08/10/2015. 6:54 PM.      Chief Complaint  Patient presents with  . Rash     The history is provided by the patient. No language interpreter was used.   HPI Comments: [name] Kirby is a 44 y.o. female who has a hx of eczema  presents to the Emergency Department complaining of an intermittent, gradual worsening rash onset one month ago. Pt notes her skin is very itchy, especially at night time or when she gets too hot. She reports having a rash on her ankles, back, breasts, abdomen and her arms. She was seen in the ED for the same symptoms a month ago and was dx with eczema. She received triamcinolone cream to alleviate the itching but reports it was not working.  Pt has been using Aveeno to help alleviate the itching. Pt denies fever, chest pain, difficulty breathing, sob, wheezing, vomiting, nausea, numbness, tingling in legs, and new lotions. Denies any new soaps, lotion, detergent, medication or any new irritants.  Past Medical History  Diagnosis Date  . Headache(784.0)   . Abnormal Pap smear   . Gonorrhea   . Trichomonas   . Depression     bipolar  . Kidney stones    Past Surgical History  Procedure Laterality Date  . Eye surgery    . Gynecologic cryosurgery    . Eye surgery    . Cesarean section  01/12/2012    Procedure: CESAREAN SECTION;  Surgeon: Adam Phenix, MD;  Location: [facility];  Service: Gynecology;  Laterality: N/A;   Family History  Problem Relation Age of Onset  . Anesthesia problems Neg Hx   . Hypotension Neg Hx   . Pseudochol deficiency Neg Hx   . Malignant hyperthermia Neg Hx   . Diabetes Mother   . Hypertension Mother   . Diabetes Maternal Grandmother   . Kidney disease  Maternal Grandmother    Social History  Substance Use Topics  . Smoking status: Former Smoker    Quit date: 01/03/2007  . Smokeless tobacco: Never Used  . Alcohol Use: No   OB History    Gravida Para Term Preterm AB TAB SAB Ectopic Multiple Living   0 0 0 0 0 0 3     Review of Systems  Skin: Positive for rash.  All other systems reviewed and are negative.     Allergies  Review of patient's allergies indicates no known allergies.  Home Medications   Prior to Admission medications   Medication Sig Start Date End Date Taking? Authorizing Provider  HYDROcodone-acetaminophen (NORCO) 5-325 MG per tablet Take 2 tablets by mouth every 4 (four) hours as needed. Patient not taking: Reported on 05/01/2015 01/11/15   Joycie Peek, PA-C  ibuprofen (ADVIL,MOTRIN) 200 MG tablet Take 400 mg by mouth every 6 (six) hours as needed for headache.    Historical Provider, MD  ketotifen (ZADITOR) 0.025 % ophthalmic solution Place 1 drop into the right eye 2 (two) times daily. Patient not taking: Reported on 05/01/2015 06/26/14   Antony Madura, PA-C  naproxen (NAPROSYN) 500 MG tablet Take 1 tablet (500 mg total) by mouth 2 (two) times daily. Patient not taking: Reported on 05/01/2015 01/11/15   Joycie Peek,  PA-C  triamcinolone cream (KENALOG) 0.1 % Apply 1 application topically 2 (two) times daily. 05/01/15   Donnetta Hutching, MD   Triage vitals: BP 122/85 mmHg  Pulse 90  Temp(Src) 98.1 F (36.7 C) (Oral)  Resp 18  SpO2 100% Physical Exam  Constitutional: She appears well-developed and well-nourished. No distress.  HENT:  Head: Normocephalic and atraumatic.  Eyes: Right eye exhibits no discharge. Left eye exhibits no discharge.  Pulmonary/Chest: Effort normal. No respiratory distress.  Neurological: She is alert. Coordination normal.  Skin: Skin is dry. Rash noted. She is not diaphoretic.  Multiple hyper pigmented lesions noted to bilateral upper and lower extremities, abdomen and back,  excoriations present, no papules, no vesicles, no pustules, and xerosis noted on bilateral hands and upper extremities  Psychiatric: She has a normal mood and affect. Her behavior is normal.  Nursing note and vitals reviewed.   ED Course  Procedures  DIAGNOSTIC STUDIES: Oxygen Saturation is 100% on RA, normal by my interpretation.  COORDINATION OF CARE:  6:53 PM Discussed treatment plan which includes continue Aveeno with pt at bedside and pt agreed to plan.  Labs Review Labs Reviewed - No data to display  Imaging Review No results found. I have personally reviewed and evaluated these images and lab results as part of my medical decision-making.  Filed Vitals:   08/10/15 1729  BP: 122/85  Pulse: 90  Temp: 98.1 F (36.7 C)  Resp: 18     MDM   Final diagnoses:  Eczema    Patient presents with rash that she has had for the past month. She was seen in the ED approximately one month ago and was prescribed triamcinolone cream. She denies any relief with triamcinolone cream but endorses mild relief with Aveeno lotion. Denies fever. Denies any new soaps, detergents, lotions, medications, or any new ear tenderness. Exam reveals hyperpigmented lesions to the upper and lower extremities, abdomen and back with excoriations and xerosis. No evidence of infection. Rash consistent with eczema. Plan to discharge patient home with steroid cream and advised patient to continue using M [place]. Patient given resource guide to follow up with primary care provider.  Evaluation does not show pathology requring ongoing emergent intervention or admission. Pt is hemodynamically stable and mentating appropriately. Discussed findings/results and plan with patient/guardian, who agrees with plan. All questions answered. Return precautions discussed and outpatient follow up given.    I personally performed the services described in this documentation, which was scribed in my presence. The recorded information  has been reviewed and is accurate.   Satira Sark [place], [place] 08/10/15 1915  Lavera Guise, MD 08/11/15 [phone]

## 2015-08-14 ENCOUNTER — Emergency Department (HOSPITAL_COMMUNITY)
Admission: EM | Admit: 2015-08-14 | Discharge: 2015-08-14 | Disposition: A | Discharge status: Home / Self Care | Payer: Medicaid Other | Attending: Emergency Medicine | Admitting: Emergency Medicine

## 2015-08-14 ENCOUNTER — Encounter (HOSPITAL_COMMUNITY): Payer: Self-pay | Admitting: Emergency Medicine

## 2015-08-14 DIAGNOSIS — L290 Pruritus ani: Secondary | ICD-10-CM | POA: Diagnosis not present

## 2015-08-14 DIAGNOSIS — Z8659 Personal history of other mental and behavioral disorders: Secondary | ICD-10-CM | POA: Insufficient documentation

## 2015-08-14 DIAGNOSIS — Z0389 Encounter for observation for other suspected diseases and conditions ruled out: Secondary | ICD-10-CM | POA: Diagnosis present

## 2015-08-14 DIAGNOSIS — R110 Nausea: Secondary | ICD-10-CM | POA: Insufficient documentation

## 2015-08-14 DIAGNOSIS — Z79899 Other long term (current) drug therapy: Secondary | ICD-10-CM | POA: Diagnosis not present

## 2015-08-14 DIAGNOSIS — Z7952 Long term (current) use of systemic steroids: Secondary | ICD-10-CM | POA: Insufficient documentation

## 2015-08-14 DIAGNOSIS — Z87891 Personal history of nicotine dependence: Secondary | ICD-10-CM | POA: Diagnosis not present

## 2015-08-14 DIAGNOSIS — Z87442 Personal history of urinary calculi: Secondary | ICD-10-CM | POA: Insufficient documentation

## 2015-08-14 DIAGNOSIS — B839 Helminthiasis, unspecified: Secondary | ICD-10-CM | POA: Diagnosis not present

## 2015-08-14 MED ORDER — ALBENDAZOLE 200 MG PO TABS
400.0000 mg | ORAL_TABLET | Freq: Once | ORAL | Status: AC
  Administered 2015-08-14: 400 mg via ORAL
  Filled 2015-08-14: qty 2

## 2015-08-14 MED ORDER — ALBENDAZOLE 200 MG PO TABS: 400.0000 mg | ORAL_TABLET | Freq: Once | ORAL | Status: DC

## 2015-08-14 NOTE — ED Notes (Signed)
Patient given milk to try to induce BM

## 2015-08-14 NOTE — ED Notes (Signed)
Pt states that she was here a few days ago for eczema.  Pt states that her mother gave her some african black soap for her skin.  States that she washed with it and when she drained the bathtub she saw little black specks in the tub.  States that she took a shower last night and "felt something in her butt hole" and that she saw something white and curved fall out of her butt.  Pt states she "tried to catch it" but it went down the drain.  Pt states she has had intermittent pain in her abd over the past month and a half but is not having pain now.  She said she wants to be "tested for worms".

## 2015-08-14 NOTE — Discharge Instructions (Signed)
Take the Albendazole 400 mg (2 tablets) in 2 weeks (August 28, 2015)  Ova and Parasite Stool Exam  WHY AM I HAVING THIS TEST?  The ova and parasite stool exam is a microscopic exam of your stool (feces) to determine whether a parasite is infecting your gastrointestinal tract. It is sometimes called a stool for ova and parasites (stool for O&P) exam. Your health care provider may order this test if you have a fever, abdominal bloating, and diarrhea that lasts for more than a few days. The exam may also be done if you have blood or mucus in your loose stools. It is often done for people who have these symptoms:  After drinking water from a well, creek, or river.  After taking antibiotic medicines for a long time to treat another illness.  After traveling internationally. The most common parasites to infect the intestinal tract in people include:  Giardia intestinalis.  Hookworm (Ascaris species).  Tapeworm (Strongyloides species).  Cryptosporidium. WHAT KIND OF SAMPLE IS TAKEN?  A stool sample is required for this test.  WILL I NEED TO COLLECT SAMPLES AT HOME?  You will likely be asked to collect a stool sample at home and bring it to the lab or office for testing. Follow your health care provider's instructions about how to collect a stool sample. This will often involve these steps:  1. Cover the toilet bowl with plastic wrap or a plastic bag. Tape the wrap or bag to the bowl of the toilet, not to the seat. Do not stretch the plastic tight across the bowl. Leave room for your stool to fall during your bowel movement. You can also use a clean plastic container if directed to do so. 2. Wash your hands thoroughly with soap and water. 3. Open the collection container given to you by your health care provider or the lab. Do not pour out the liquid that is in the container. This liquid will preserve your stool sample until it is tested in the lab. 4. Have a bowel movement (defecate) into the plastic  bag, wrap, or container that is secured to the toilet. 5. Use the small shovel that comes with the stool collection container to collect several scoops of stool from the plastic wrap, bag, or container. Place these scoops into the collection container. If there is any blood or mucus in your stool, collect that and place it in the collection container as well. 6. Stir the stool sample into the liquid in the container if directed to do so by the instructions that came with the collection container. 7. Close the lid of the collection container tightly. If directed to do so by the instructions, you may need to gently mix the stool and liquid by turning the container upside down several times. 8. On the label, write the date, time, and your initials. 9. Put the container in the bag that was given to you. 10. If your health care provider wants you to collect more than one sample, collect them at different times as you have been directed. 11. Flush the rest of the stool from your bowel movement down the toilet. 12. Wash your hands thoroughly with soap and water. HOW ARE THE TEST RESULTS REPORTED?  Your test results will be reported as either positive or negative for ova and parasites. It is your responsibility to obtain your test results. Ask the lab or department performing the test when and how you will get your results.  WHAT DO THE RESULTS  MEAN?  A positive (abnormal) test means that you have a parasite infection in your intestinal tract. The test results will specify which type of parasite is present.  Talk with your health care provider to discuss your results, treatment options, and if necessary, the need for more tests. Talk with your health care provider if you have any questions about your results.  This information is not intended to replace advice given to you by your health care provider. Make sure you discuss any questions you have with your health care provider.  Document Released: 11/18/2004  Document Revised: 11/06/2014 Document Reviewed: 03/09/2014  [employer]vier Interactive Patient Education [employer]

## 2015-08-14 NOTE — ED Provider Notes (Signed)
CSN: [id number]     Arrival date & time 08/14/15  1621 History   First MD Initiated Contact with Patient 08/14/15 1638     Chief Complaint  Patient presents with  . "I think I had a worm in my butt"      (Consider location/radiation/quality/duration/timing/severity/associated sxs/prior Treatment) HPI  [name] Kirby is a 44 y.o. female with PMH significant for headache, nephrolithiasis, and bipolar depression who presents for evaluation of worms.  Patient reports that a few days ago after bathing, she noticed a 2.5 cm flat thin object in the bathtub.  She then reports that during her shower last night she felt something was in her anus and then a 2.5 object fell out.  She states that he was about the width of her pinky.  Patient also has a small child with her in the room and she states that he has been frequently itching his bottom.  She states that her bottom itches most of the time, but is worse at night.    Past Medical History  Diagnosis Date  . Headache(784.0)   . Abnormal Pap smear   . Gonorrhea   . Trichomonas   . Depression     bipolar  . Kidney stones    Past Surgical History  Procedure Laterality Date  . Eye surgery    . Gynecologic cryosurgery    . Eye surgery    . Cesarean section  01/12/2012    Procedure: CESAREAN SECTION;  Surgeon: Adam Phenix, MD;  Location: [facility];  Service: Gynecology;  Laterality: N/A;   Family History  Problem Relation Age of Onset  . Anesthesia problems Neg Hx   . Hypotension Neg Hx   . Pseudochol deficiency Neg Hx   . Malignant hyperthermia Neg Hx   . Diabetes Mother   . Hypertension Mother   . Diabetes Maternal Grandmother   . Kidney disease Maternal Grandmother    Social History  Substance Use Topics  . Smoking status: Former Smoker    Quit date: 01/03/2007  . Smokeless tobacco: Never Used  . Alcohol Use: No   OB History    Gravida Para Term Preterm AB TAB SAB Ectopic Multiple Living   0 0 0 0 0 0 3      Review of Systems  Constitutional: Negative for fever and chills.  Respiratory: Negative for shortness of breath.   Cardiovascular: Negative for chest pain.  Gastrointestinal: Positive for nausea, abdominal pain and abdominal distention. Negative for vomiting, diarrhea and blood in stool.       Anal pruritis  Genitourinary: Negative.       Allergies  Triamcinolone  Home Medications   Prior to Admission medications   Medication Sig Start Date End Date Taking? Authorizing Provider  desonide (DESOWEN) 0.05 % cream Apply topically 2 (two) times daily. 08/10/15  Yes Barrett Henle, PA-C  diphenhydrAMINE (BENADRYL) 25 MG tablet Take 25 mg by mouth at bedtime as needed.   Yes Historical Provider, MD  ibuprofen (ADVIL,MOTRIN) 200 MG tablet Take 400 mg by mouth 2 (two) times daily as needed for headache.    Yes Historical Provider, MD  albendazole (ALBENZA) 200 MG tablet Take 2 tablets (400 mg total) by mouth once. Take 2 tablets (400 mg total) by mouth once on August 28, 2015 (2 weeks from today). 08/14/15   [name] Fowler, PA-C  HYDROcodone-acetaminophen (NORCO) 5-325 MG per tablet Take 2 tablets by mouth every 4 (four) hours as needed. Patient  not taking: Reported on 05/01/2015 01/11/15   Joycie PeekBenjamin Cartner, PA-C  ketotifen (ZADITOR) 0.025 % ophthalmic solution Place 1 drop into the right eye 2 (two) times daily. Patient not taking: Reported on 05/01/2015 06/26/14   Antony MaduraKelly Humes, PA-C  naproxen (NAPROSYN) 500 MG tablet Take 1 tablet (500 mg total) by mouth 2 (two) times daily. Patient not taking: Reported on 05/01/2015 01/11/15   Joycie PeekBenjamin Cartner, PA-C  triamcinolone cream (KENALOG) 0.1 % Apply 1 application topically 2 (two) times daily. 05/01/15   Donnetta HutchingBrian Cook, MD   BP 139/77 mmHg  Pulse 94  Temp(Src) 98.4 F (36.9 C) (Oral)  Resp 16  SpO2 100% Physical Exam  Constitutional: She is oriented to person, place, and time. She appears well-developed and well-nourished.  HENT:  Head:  Normocephalic and atraumatic.  Mouth/Throat: Oropharynx is clear and moist.  Eyes: Conjunctivae are normal. Pupils are equal, round, and reactive to light.  Neck: Normal range of motion. Neck supple.  Cardiovascular: Normal rate, regular rhythm and normal heart sounds.   No murmur heard. Pulmonary/Chest: Effort normal and breath sounds normal. No respiratory distress. She has no wheezes. She has no rales.  Abdominal: Soft. Bowel sounds are normal. She exhibits no distension.  Mild abdominal tenderness  Genitourinary: Rectum normal.  No rectal excoriations or lesions.  Musculoskeletal: Normal range of motion.  Lymphadenopathy:    She has no cervical adenopathy.  Neurological: She is alert and oriented to person, place, and time.  Speech clear without dysarthria.  Skin: Skin is warm and dry.  Psychiatric: She has a normal mood and affect. Her behavior is normal.    ED Course  Procedures (including critical care time) Labs Review Labs Reviewed  OVA AND PARASITE EXAMINATION  STOOL CULTURE    Imaging Review No results found. I have personally reviewed and evaluated these images and lab results as part of my medical decision-making.   EKG Interpretation None      MDM   Final diagnoses:  Helminth infection    Patient presents for evaluation of worms.  Patient has been experiencing anal pruritis, and states she has seen worms in the shower and bathtub.  VSS, NAD, patient appears non-toxic.  On exam, mild generalized abdominal tenderness.  Abdomen is non-distended.  Rectum appears normal.  Will treat empirically with albendazole.   Patient stable for discharge.  Discussed return precautions.  Patient agrees with the above plan for discharge.  Case has been discussed with Dr. Freida BusmanAllen who agrees with the above plan for discharge.      [name] FowlerKayla Unita Detamore, PA-C 08/14/15 1847  Lorre NickAnthony Allen, MD 08/15/15 [id number]

## 2015-08-17 LAB — OVA AND PARASITE EXAMINATION: OVA AND PARASITES: NONE SEEN

## 2016-01-03 ENCOUNTER — Emergency Department (HOSPITAL_COMMUNITY)
Admission: EM | Admit: 2016-01-03 | Discharge: 2016-01-03 | Disposition: A | Discharge status: Home / Self Care | Payer: Medicaid Other | Attending: Emergency Medicine | Admitting: Emergency Medicine

## 2016-01-03 ENCOUNTER — Emergency Department (HOSPITAL_COMMUNITY): Payer: Medicaid Other

## 2016-01-03 ENCOUNTER — Encounter (HOSPITAL_COMMUNITY): Payer: Self-pay | Admitting: Emergency Medicine

## 2016-01-03 ENCOUNTER — Encounter: Payer: Self-pay | Admitting: Physician Assistant

## 2016-01-03 DIAGNOSIS — Z8659 Personal history of other mental and behavioral disorders: Secondary | ICD-10-CM | POA: Insufficient documentation

## 2016-01-03 DIAGNOSIS — Z87891 Personal history of nicotine dependence: Secondary | ICD-10-CM | POA: Diagnosis not present

## 2016-01-03 DIAGNOSIS — R1011 Right upper quadrant pain: Secondary | ICD-10-CM | POA: Diagnosis not present

## 2016-01-03 DIAGNOSIS — Z8619 Personal history of other infectious and parasitic diseases: Secondary | ICD-10-CM | POA: Insufficient documentation

## 2016-01-03 DIAGNOSIS — Z87442 Personal history of urinary calculi: Secondary | ICD-10-CM | POA: Diagnosis not present

## 2016-01-03 DIAGNOSIS — Z7952 Long term (current) use of systemic steroids: Secondary | ICD-10-CM | POA: Insufficient documentation

## 2016-01-03 DIAGNOSIS — M7918 Myalgia, other site: Secondary | ICD-10-CM

## 2016-01-03 DIAGNOSIS — R079 Chest pain, unspecified: Secondary | ICD-10-CM | POA: Diagnosis present

## 2016-01-03 LAB — BASIC METABOLIC PANEL
Anion gap: 7 (ref 5–15)
BUN: 7 mg/dL (ref 6–20)
CALCIUM: 9.2 mg/dL (ref 8.9–10.3)
CO2: 20 mmol/L — ABNORMAL LOW (ref 22–32)
Chloride: 107 mmol/L (ref 101–111)
Creatinine, Ser: 0.44 mg/dL (ref 0.44–1.00)
GLUCOSE: 85 mg/dL (ref 65–99)
Potassium: 3.9 mmol/L (ref 3.5–5.1)
SODIUM: 134 mmol/L — AB (ref 135–145)

## 2016-01-03 LAB — HEPATIC FUNCTION PANEL
ALK PHOS: 53 U/L (ref 38–126)
ALT: <5 U/L — ABNORMAL LOW (ref 14–54)
AST: 17 U/L (ref 15–41)
Albumin: 4 g/dL (ref 3.5–5.0)
BILIRUBIN INDIRECT: 0.8 mg/dL (ref 0.3–0.9)
Bilirubin, Direct: 0.1 mg/dL (ref 0.1–0.5)
TOTAL PROTEIN: 7.9 g/dL (ref 6.5–8.1)
Total Bilirubin: 0.9 mg/dL (ref 0.3–1.2)

## 2016-01-03 LAB — CBC
HEMATOCRIT: 43.3 % (ref 36.0–46.0)
Hemoglobin: 14.3 g/dL (ref 12.0–15.0)
MCH: 32 pg (ref 26.0–34.0)
MCHC: 33 g/dL (ref 30.0–36.0)
MCV: 96.9 fL (ref 78.0–100.0)
PLATELETS: 281 10*3/uL (ref 150–400)
RBC: 4.47 MIL/uL (ref 3.87–5.11)
RDW: 13.2 % (ref 11.5–15.5)
WBC: 5.6 10*3/uL (ref 4.0–10.5)

## 2016-01-03 LAB — TROPONIN I

## 2016-01-03 LAB — LIPASE, BLOOD: LIPASE: 25 U/L (ref 11–51)

## 2016-01-03 MED ORDER — MORPHINE SULFATE (PF) 4 MG/ML IV SOLN: 4.0000 mg | Freq: Once | INTRAVENOUS | Status: DC

## 2016-01-03 MED ORDER — ONDANSETRON 4 MG PO TBDP
4.0000 mg | ORAL_TABLET | Freq: Once | ORAL | Status: AC
  Administered 2016-01-03: 4 mg via ORAL
  Filled 2016-01-03: qty 1

## 2016-01-03 MED ORDER — MORPHINE SULFATE (PF) 4 MG/ML IV SOLN
4.0000 mg | Freq: Once | INTRAVENOUS | Status: AC
  Administered 2016-01-03: 4 mg via INTRAMUSCULAR
  Filled 2016-01-03: qty 1

## 2016-01-03 NOTE — Discharge Instructions (Signed)
Abdominal Pain, Adult Follow up with Gastroenterology. Return for fever, jaundice, or increased abdominal pain.  Many things can cause abdominal pain. Usually, abdominal pain is not caused by a disease and will improve without treatment. It can often be observed and treated at home. Your health care provider will do a physical exam and possibly order blood tests and X-rays to help determine the seriousness of your pain. However, in many cases, more time must pass before a clear cause of the pain can be found. Before that point, your health care provider may not know if you need more testing or further treatment. HOME CARE INSTRUCTIONS Monitor your abdominal pain for any changes. The following actions may help to alleviate any discomfort you are experiencing:  Only take over-the-counter or prescription medicines as directed by your health care provider.  Do not take laxatives unless directed to do so by your health care provider.  Try a clear liquid diet (broth, tea, or water) as directed by your health care provider. Slowly move to a bland diet as tolerated. SEEK MEDICAL CARE IF:  You have unexplained abdominal pain.  You have abdominal pain associated with nausea or diarrhea.  You have pain when you urinate or have a bowel movement.  You experience abdominal pain that wakes you in the night.  You have abdominal pain that is worsened or improved by eating food.  You have abdominal pain that is worsened with eating fatty foods.  You have a fever. SEEK IMMEDIATE MEDICAL CARE IF:  Your pain does not go away within 2 hours.  You keep throwing up (vomiting).  Your pain is felt only in portions of the abdomen, such as the right side or the left lower portion of the abdomen.  You pass bloody or black tarry stools. MAKE SURE YOU:  Understand these instructions.  Will watch your condition.  Will get help right away if you are not doing well or get worse.   This information is not  intended to replace advice given to you by your health care provider. Make sure you discuss any questions you have with your health care provider.   Document Released: 07/26/2005 Document Revised: 07/07/2015 Document Reviewed: 06/25/2013 Elsevier Interactive Patient Education [employer]2016 Elsevier Inc.

## 2016-01-03 NOTE — ED Provider Notes (Signed)
CSN: [id number]     Arrival date & time 01/03/16  0914 History   First MD Initiated Contact with Patient 01/03/16 1117     Chief Complaint  Patient presents with  . Chest Pain   (Consider location/radiation/quality/duration/timing/severity/associated sxs/prior Treatment) Patient is a 45 y.o. female presenting with chest pain. The history is provided by the patient. No language interpreter was used.  Chest Pain Associated symptoms: no fever and no shortness of breath   [name] Kirby a 45 year old female who presents with sudden onset intermittent tearing chest pain 3 days. It began after cleaning the tub. She took ibuprofen mostly with minimal relief. States her symptoms are similar to when she had called on her stones. Mother had family history of MI. States she may have pulled a muscle.  Worse with movement, especially with lying flat. Not worse with eating. Had a couple episodes of diarrhea last night without blood.    Denies any fevers, chills, cough, shortness of breath, vomiting. Denies any history of smoking.  Past Medical History  Diagnosis Date  . Headache(784.0)   . Abnormal Pap smear   . Gonorrhea   . Trichomonas   . Depression     bipolar  . Kidney stones    Past Surgical History  Procedure Laterality Date  . Eye surgery    . Gynecologic cryosurgery    . Eye surgery    . Cesarean section  01/12/2012    Procedure: CESAREAN SECTION;  Surgeon: Adam PhenixJames G Arnold, MD;  Location: [facility];  Service: Gynecology;  Laterality: N/A;   Family History  Problem Relation Age of Onset  . Anesthesia problems Neg Hx   . Hypotension Neg Hx   . Pseudochol deficiency Neg Hx   . Malignant hyperthermia Neg Hx   . Diabetes Mother   . Hypertension Mother   . Diabetes Maternal Grandmother   . Kidney disease Maternal Grandmother    Social History  Substance Use Topics  . Smoking status: Former Smoker    Quit date: 01/03/2007  . Smokeless tobacco: Never Used  . Alcohol Use: No   OB  History    Gravida Para Term Preterm AB TAB SAB Ectopic Multiple Living   3 3 3  0 0 0 0 0 0 3     Review of Systems  Constitutional: Negative for fever.  Respiratory: Negative for shortness of breath.   Cardiovascular: Positive for chest pain.  All other systems reviewed and are negative.     Allergies  Triamcinolone  Home Medications   Prior to Admission medications   Medication Sig Start Date End Date Taking? Authorizing Provider  albendazole (ALBENZA) 200 MG tablet Take 2 tablets (400 mg total) by mouth once. Take 2 tablets (400 mg total) by mouth once on August 28, 2015 (2 weeks from today). 08/14/15   Cheri FowlerKayla Rose, PA-C  desonide (DESOWEN) 0.05 % cream Apply topically 2 (two) times daily. 08/10/15   Barrett HenleNicole Elizabeth Nadeau, PA-C  diphenhydrAMINE (BENADRYL) 25 MG tablet Take 25 mg by mouth at bedtime as needed.    Historical Provider, MD  HYDROcodone-acetaminophen (NORCO) 5-325 MG per tablet Take 2 tablets by mouth every 4 (four) hours as needed. Patient not taking: Reported on 05/01/2015 01/11/15   Joycie PeekBenjamin Cartner, PA-C  ibuprofen (ADVIL,MOTRIN) 200 MG tablet Take 400 mg by mouth 2 (two) times daily as needed for headache.     Historical Provider, MD  ketotifen (ZADITOR) 0.025 % ophthalmic solution Place 1 drop into the right eye 2 (two) times  daily. Patient not taking: Reported on 05/01/2015 06/26/14   Antony Madura, PA-C  naproxen (NAPROSYN) 500 MG tablet Take 1 tablet (500 mg total) by mouth 2 (two) times daily. Patient not taking: Reported on 05/01/2015 01/11/15   Joycie Peek, PA-C  triamcinolone cream (KENALOG) 0.1 % Apply 1 application topically 2 (two) times daily. 05/01/15   Donnetta Hutching, MD   BP 133/83 mmHg  Pulse 80  Temp(Src) 98 F (36.7 C) (Oral)  Resp 16  SpO2 100% Physical Exam  Constitutional: She is oriented to person, place, and time. She appears well-developed and well-nourished. No distress.  HENT:  Head: Normocephalic and atraumatic.  Eyes: Conjunctivae are  normal.  Neck: Normal range of motion. Neck supple.  Cardiovascular: Normal rate, regular rhythm and normal heart sounds.   Regular rate and rhythm. No murmur.  Pulmonary/Chest: Effort normal and breath sounds normal. No accessory muscle usage. No respiratory distress. She has no decreased breath sounds. She has no wheezes.  Lungs clear to auscultation bilaterally. No respiratory distress or use of accessory muscles. No decreased breath sounds or wheezing.  Abdominal: Soft. Normal appearance. She exhibits no distension. There is tenderness in the right upper quadrant. There is no rebound, no guarding and no CVA tenderness.    Musculoskeletal: Normal range of motion.  Neurological: She is alert and oriented to person, place, and time.  Skin: Skin is warm and dry.  No rash.  Nursing note and vitals reviewed.   ED Course  Procedures (including critical care time) Labs Review Labs Reviewed  BASIC METABOLIC PANEL - Abnormal; Notable for the following:    Sodium 134 (*)    CO2 20 (*)    All other components within normal limits  HEPATIC FUNCTION PANEL - Abnormal; Notable for the following:    ALT <5 (*)    All other components within normal limits  CBC  TROPONIN I  LIPASE, BLOOD    Imaging Review Dg Chest 2 View  01/03/2016  CLINICAL DATA:  Right side chest pain and shortness of breath since 12/30/2015, worsened today. Initial encounter. EXAM: CHEST  2 VIEW COMPARISON:  PA and lateral chest 12/27/2012. FINDINGS: The lungs are clear. Heart size is normal. There is no pneumothorax or pleural effusion. IMPRESSION: No acute disease. Electronically Signed   By: Drusilla Kanner M.D.   On: 01/03/2016 09:48   I have personally reviewed and evaluated these images and lab results as part of my medical decision-making.   EKG Interpretation   Date/Time:  Monday January 03 2016 09:31:57 EST Ventricular Rate:  75 PR Interval:  161 QRS Duration: 84 QT Interval:  361 QTC Calculation: 403 R  Axis:   58 Text Interpretation:  Sinus rhythm RSR' in V1 or V2, probably normal  variant No significant change since last tracing Confirmed by LITTLE MD,  RACHEL [phone]) on 01/03/2016 12:38:43 PM      MDM   Final diagnoses:  RUQ abdominal pain  Musculoskeletal pain   Patient presents for Right upper quadrant abdominal pain 3 days. She describes as chest pain but I do not believe this is ACS related. I reviewed the heart score and she is low risk. Chest x-ray is negative for pneumothorax or pneumonia. She points to her abdomen. Vital signs are stable. Labs are unremarkable including lipase and LFTs. This is most likely a muscle pull or cholelithiasis. She has pain over the right ribs also but no signs of trauma or fall. I gave the patient referral to GI. Return  precautions were discussed and patient agrees with the plan.     Catha Gosselin, PA-C 01/03/16 1522  Laurence Spates, MD 01/04/16 [phone]

## 2016-01-03 NOTE — ED Notes (Signed)
Pt reports intermittent generalized chest pain for 3 days; denies cough or fevers. Reports hx of gallstones and reports this pain is similar.

## 2016-01-03 NOTE — ED Notes (Signed)
Patient is alert and oriented x3.  She was given DC instructions and follow up visit instructions.  Patient gave verbal understanding. She was DC ambulatory under her own power to home.  V/S stable.  He was not showing any signs of distress on DC 

## 2016-01-12 ENCOUNTER — Encounter: Payer: Self-pay | Admitting: Physician Assistant

## 2016-01-12 ENCOUNTER — Ambulatory Visit (INDEPENDENT_AMBULATORY_CARE_PROVIDER_SITE_OTHER): Payer: Medicaid Other | Admitting: Physician Assistant

## 2016-01-12 VITALS — BP 108/64 | HR 88 | Ht 63.0 in | Wt 129.0 lb

## 2016-01-12 DIAGNOSIS — R1011 Right upper quadrant pain: Secondary | ICD-10-CM | POA: Diagnosis not present

## 2016-01-12 DIAGNOSIS — M940 Chondrocostal junction syndrome [Tietze]: Secondary | ICD-10-CM

## 2016-01-12 NOTE — Patient Instructions (Signed)
You can try tums. We have given you samples of the Over the Counter Nexium for your stomach.   You have been scheduled for an abdominal ultrasound at [facility] (1st floor of hospital) on Tues 3-21-2017at 9:15 am.  . Please arrive at 9:00 am to your appointment for registration. Make certain not to have anything to eat or drink 6 hours prior to your appointment. Should you need to reschedule your appointment, please contact radiology at [phone]. This test typically takes about 30 minutes to perform.

## 2016-01-12 NOTE — Progress Notes (Signed)
Patient ID: [name] Kirby, female   DOB: 17-Apr-1971, 45 y.o.   MRN: [id number]   Subjective:    Patient ID: [name] Kirby, female    DOB: 17-Apr-1971, 45 y.o.   MRN: [id number]  HPI [name] Kirby  is a 45 year old African-American female new to GI today referred by the [facility] for evaluation of right upper quadrant pain which was actually felt to be musculoskeletal in etiology. Patient had an ER visit on 01/03/2016 at that time complaining of upper abdominal and lower chest discomfort for 3 days which she described as a "tearing" type of pain. She says it was uncomfortable to take deep breaths all following or injuring herself. She was noted to have pain over her anterior ribs. She was started on meloxicam and received a Toradol injection in the emergency room. She had chest x-ray which was unremarkable. Labs with CBC lipase and hepatic panel all within normal limits. Patient says she has been taking some ibuprofen over the past week and also just started the meloxicam. She says she feels better but still has some minimal discomfort. Says this morning she woke up a little bit nauseated but that was the first of this. She has noticed some indigestion since being on the anti-inflammatories. She tells me that she did have a gallbladder work[place] few years back [place] in [place]. She says she was told at one point that she had a gallbladder attack but had an ultrasound in 2014 apparently was negative.  Review of Systems Pertinent positive and negative review of systems were noted in the above HPI section.  All other review of systems was otherwise negative.  Outpatient Encounter Prescriptions as of 01/12/2016  Medication Sig  . desonide (DESOWEN) 0.05 % cream Apply topically 2 (two) times daily.  Marland Kitchen. ibuprofen (ADVIL,MOTRIN) 200 MG tablet Take 400 mg by mouth 2 (two) times daily as needed for headache.   . meloxicam (MOBIC) 15 MG tablet Take 15 mg by mouth daily.  .  sertraline (ZOLOFT) 50 MG tablet Take 50 mg by mouth daily.  . traZODone (DESYREL) 50 MG tablet Take 50 mg by mouth at bedtime.  . [DISCONTINUED] albendazole (ALBENZA) 200 MG tablet Take 2 tablets (400 mg total) by mouth once. Take 2 tablets (400 mg total) by mouth once on August 28, 2015 (2 weeks from today).  . [DISCONTINUED] diphenhydrAMINE (BENADRYL) 25 MG tablet Take 25 mg by mouth at bedtime as needed.  . [DISCONTINUED] HYDROcodone-acetaminophen (NORCO) 5-325 MG per tablet Take 2 tablets by mouth every 4 (four) hours as needed. (Patient not taking: Reported on 05/01/2015)  . [DISCONTINUED] ketotifen (ZADITOR) 0.025 % ophthalmic solution Place 1 drop into the right eye 2 (two) times daily. (Patient not taking: Reported on 05/01/2015)  . [DISCONTINUED] naproxen (NAPROSYN) 500 MG tablet Take 1 tablet (500 mg total) by mouth 2 (two) times daily. (Patient not taking: Reported on 05/01/2015)  . [DISCONTINUED] triamcinolone cream (KENALOG) 0.1 % Apply 1 application topically 2 (two) times daily.   No facility-administered encounter medications on file as of 01/12/2016.   Allergies  Allergen Reactions  . Triamcinolone Itching and Rash   There are no active problems to display for this patient.  Social History   Social History  . Marital Status: Legally Separated    Spouse Name: N/A  . Number of Children: N/A  . Years of Education: N/A   Occupational History  . Not on file.   Social History Main Topics  . Smoking status: Former  Smoker    Quit date: 01/03/2007  . Smokeless tobacco: Never Used  . Alcohol Use: No  . Drug Use: No  . Sexual Activity: Yes   Other Topics Concern  . Not on file   Social History Narrative    Ms. [name] Kirby family history includes Diabetes in her maternal grandmother and mother; Hypertension in her mother; Kidney disease in her maternal grandmother. There is no history of Anesthesia problems, Hypotension, Pseudochol deficiency, or Malignant  hyperthermia.      Objective:    Filed Vitals:   01/12/16 1125  BP: 108/64  Pulse: 88    Physical Exam    well-developed African American female in no acute distress, blood pressure 108/64 pulse 88 height 5 foot 3 weight 129. HEENT; nontraumatic normocephalic EOMI PERRLA sclera anicteric, Cardiovascular ;regular rate and rhythm with S1-S2 no murmur or gallop, Pulmonary ;clear bilaterally there is some mild tenderness along both costal margins anteriorly, Abdomen ;soft and basically nontender there is no palpable mass or hepatosplenomegaly bowel sounds are present, Recta;l exam not done, Ext;no clubbing cyanosis or edema skin warm dry, Neuropsych ;mood and affect appropriate     Assessment & Plan:   #1 45 yo female with recent ER Visit with upper abdominal and lower chest discomfort most consistent with musculoskeletal etiology, suspect costochondritis. She is improving on NSAIDs. #2 mild dyspepsia since starting NSAIDs  Plan; as she is significantly improved have encouraged her to stop taking both ibuprofen and meloxicam Was given a few samples of OTC Nexium to take over the next few days for dyspepsia Will schedule for upper abdominal ultrasound because of the prior question of gallbladder issues. Patient will be established with Dr. Steva Ready PA-C 01/12/2016   Cc: No ref. provider found

## 2016-01-12 NOTE — Progress Notes (Signed)
Agree with assessment and plan as outlined.  

## 2016-01-18 ENCOUNTER — Ambulatory Visit (HOSPITAL_COMMUNITY)
Admission: RE | Admit: 2016-01-18 | Discharge: 2016-01-18 | Disposition: A | Discharge status: Home / Self Care | Payer: Medicaid Other | Source: Ambulatory Visit | Attending: Physician Assistant | Admitting: Physician Assistant

## 2016-01-18 DIAGNOSIS — N281 Cyst of kidney, acquired: Secondary | ICD-10-CM | POA: Diagnosis not present

## 2016-01-18 DIAGNOSIS — K8020 Calculus of gallbladder without cholecystitis without obstruction: Secondary | ICD-10-CM | POA: Insufficient documentation

## 2016-01-18 DIAGNOSIS — R1011 Right upper quadrant pain: Secondary | ICD-10-CM | POA: Diagnosis not present

## 2016-02-25 ENCOUNTER — Encounter (HOSPITAL_COMMUNITY): Payer: Self-pay | Admitting: Emergency Medicine

## 2016-02-25 ENCOUNTER — Emergency Department (HOSPITAL_COMMUNITY): Payer: Medicaid Other

## 2016-02-25 ENCOUNTER — Emergency Department (HOSPITAL_COMMUNITY)
Admission: EM | Admit: 2016-02-25 | Discharge: 2016-02-26 | Disposition: A | Discharge status: Home / Self Care | Payer: Medicaid Other | Attending: Emergency Medicine | Admitting: Emergency Medicine

## 2016-02-25 DIAGNOSIS — R1084 Generalized abdominal pain: Secondary | ICD-10-CM | POA: Insufficient documentation

## 2016-02-25 DIAGNOSIS — Z7901 Long term (current) use of anticoagulants: Secondary | ICD-10-CM | POA: Insufficient documentation

## 2016-02-25 DIAGNOSIS — F319 Bipolar disorder, unspecified: Secondary | ICD-10-CM | POA: Diagnosis not present

## 2016-02-25 DIAGNOSIS — R109 Unspecified abdominal pain: Secondary | ICD-10-CM | POA: Diagnosis present

## 2016-02-25 DIAGNOSIS — Z79899 Other long term (current) drug therapy: Secondary | ICD-10-CM | POA: Diagnosis not present

## 2016-02-25 DIAGNOSIS — Z87891 Personal history of nicotine dependence: Secondary | ICD-10-CM | POA: Diagnosis not present

## 2016-02-25 DIAGNOSIS — I2699 Other pulmonary embolism without acute cor pulmonale: Secondary | ICD-10-CM | POA: Diagnosis not present

## 2016-02-25 DIAGNOSIS — Z791 Long term (current) use of non-steroidal anti-inflammatories (NSAID): Secondary | ICD-10-CM | POA: Diagnosis not present

## 2016-02-25 LAB — CBC
HCT: 39.4 % (ref 36.0–46.0)
Hemoglobin: 13 g/dL (ref 12.0–15.0)
MCH: 31.2 pg (ref 26.0–34.0)
MCHC: 33 g/dL (ref 30.0–36.0)
MCV: 94.5 fL (ref 78.0–100.0)
Platelets: 363 10*3/uL (ref 150–400)
RBC: 4.17 MIL/uL (ref 3.87–5.11)
RDW: 13.8 % (ref 11.5–15.5)
WBC: 5.7 10*3/uL (ref 4.0–10.5)

## 2016-02-25 LAB — I-STAT BETA HCG BLOOD, ED (MC, WL, AP ONLY): I-stat hCG, quantitative: <5 m[IU]/mL (ref <5)

## 2016-02-25 LAB — URINALYSIS, ROUTINE W REFLEX MICROSCOPIC
Bilirubin Urine: NEGATIVE
Glucose, UA: NEGATIVE mg/dL
Hgb urine dipstick: NEGATIVE
Ketones, ur: NEGATIVE mg/dL
Leukocytes, UA: NEGATIVE
Nitrite: NEGATIVE
Protein, ur: NEGATIVE mg/dL
Specific Gravity, Urine: 1.021 (ref 1.005–1.030)
pH: 7 (ref 5.0–8.0)

## 2016-02-25 LAB — COMPREHENSIVE METABOLIC PANEL
ALT: <5 U/L — ABNORMAL LOW (ref 14–54)
AST: 14 U/L — ABNORMAL LOW (ref 15–41)
Albumin: 3.9 g/dL (ref 3.5–5.0)
Alkaline Phosphatase: 64 U/L (ref 38–126)
Anion gap: 9 (ref 5–15)
BUN: 10 mg/dL (ref 6–20)
CO2: 24 mmol/L (ref 22–32)
Calcium: 9.3 mg/dL (ref 8.9–10.3)
Chloride: 105 mmol/L (ref 101–111)
Creatinine, Ser: 0.64 mg/dL (ref 0.44–1.00)
GFR calc Af Amer: >60 mL/min (ref >60)
GFR calc non Af Amer: >60 mL/min (ref >60)
Glucose, Bld: 108 mg/dL — ABNORMAL HIGH (ref 65–99)
Potassium: 3.8 mmol/L (ref 3.5–5.1)
Sodium: 138 mmol/L (ref 135–145)
Total Bilirubin: 0.2 mg/dL — ABNORMAL LOW (ref 0.3–1.2)
Total Protein: 8.1 g/dL (ref 6.5–8.1)

## 2016-02-25 LAB — LIPASE, BLOOD: Lipase: 26 U/L (ref 11–51)

## 2016-02-25 MED ORDER — IOPAMIDOL (ISOVUE-370) INJECTION 76%
100.0000 mL | Freq: Once | INTRAVENOUS | Status: AC | PRN
  Administered 2016-02-25: 100 mL via INTRAVENOUS

## 2016-02-25 NOTE — ED Provider Notes (Signed)
CSN: [id number]     Arrival date & time 02/25/16  1743 History   First MD Initiated Contact with Patient 02/25/16 2051     Chief Complaint  Patient presents with  . Abdominal Pain     (Consider location/radiation/quality/duration/timing/severity/associated sxs/prior Treatment) HPI [name] Kirby is a 45 y.o. female because in for evaluation of left-sided abdominal and flank pain. Patient reports symptoms started yesterday around 1:00 in the afternoon. She reports a gradual onset of left flank pain that is waxing and waning and "feels like a contraction, but it isn't". She reports associated nausea and vomiting. No fevers, chills, overt urinary symptoms, vaginal bleeding or discharge. Past Medical History  Diagnosis Date  . Headache(784.0)   . Abnormal Pap smear   . Gonorrhea   . Trichomonas   . Depression     bipolar  . Kidney stones   . Anxiety    Past Surgical History  Procedure Laterality Date  . Eye surgery Left 1977  . Gynecologic cryosurgery    . Eye surgery    . Cesarean section  01/12/2012    Procedure: CESAREAN SECTION;  Surgeon: Adam Phenix, MD;  Location: [facility];  Service: Gynecology;  Laterality: N/A;   Family History  Problem Relation Age of Onset  . Anesthesia problems Neg Hx   . Hypotension Neg Hx   . Pseudochol deficiency Neg Hx   . Malignant hyperthermia Neg Hx   . Diabetes Mother   . Hypertension Mother   . Diabetes Maternal Grandmother   . Kidney disease Maternal Grandmother    Social History  Substance Use Topics  . Smoking status: Former Smoker    Quit date: 01/03/2007  . Smokeless tobacco: Never Used  . Alcohol Use: No   OB History    Gravida Para Term Preterm AB TAB SAB Ectopic Multiple Living   3 3 3  0 0 0 0 0 0 3     Review of Systems A 10 point review of systems was completed and was negative except for pertinent positives and negatives as mentioned in the history of present illness     Allergies  Triamcinolone  Home  Medications   Prior to Admission medications   Medication Sig Start Date End Date Taking? Authorizing Provider  cyclobenzaprine (FLEXERIL) 10 MG tablet Take 10 mg by mouth at bedtime.   Yes Historical Provider, MD  desonide (DESOWEN) 0.05 % lotion Apply 1 application topically 2 (two) times daily. 01/17/16  Yes Historical Provider, MD  ibuprofen (ADVIL,MOTRIN) 200 MG tablet Take 400 mg by mouth 2 (two) times daily as needed for headache.    Yes Historical Provider, MD  sertraline (ZOLOFT) 50 MG tablet Take 50 mg by mouth daily.   Yes Historical Provider, MD  traZODone (DESYREL) 50 MG tablet Take 50 mg by mouth at bedtime.   Yes Historical Provider, MD  ursodiol (ACTIGALL) 300 MG capsule Take 300 mg by mouth 2 (two) times daily.   Yes Historical Provider, MD  desonide (DESOWEN) 0.05 % cream Apply topically 2 (two) times daily. Patient not taking: Reported on 02/25/2016 08/10/15   Barrett Henle, PA-C  Rivaroxaban 15 & 20 MG TBPK Take as directed on package: Start with one 15mg  tablet by mouth twice a day with food. On Day 22, switch to one 20mg  tablet once a day with food. 02/26/16   Joycie Peek, PA-C   BP 127/89 mmHg  Pulse 91  Temp(Src) 98.4 F (36.9 C) (Oral)  Resp 16  SpO2  100%  LMP  Physical Exam  Constitutional: She is oriented to person, place, and time. She appears well-developed and well-nourished.  HENT:  Head: Normocephalic and atraumatic.  Mouth/Throat: Oropharynx is clear and moist.  Eyes: Conjunctivae are normal. Pupils are equal, round, and reactive to light. Right eye exhibits no discharge. Left eye exhibits no discharge. No scleral icterus.  Neck: Neck supple.  Cardiovascular: Normal rate, regular rhythm and normal heart sounds.   Pulmonary/Chest: Effort normal and breath sounds normal. No respiratory distress. She has no wheezes. She has no rales.  Abdominal: Soft.  Mild diffuse tenderness in left flank, no focal abdominal pain. Abdomen nondistended without  rebound or guarding. No CVA tenderness  Musculoskeletal: She exhibits no tenderness.  Neurological: She is alert and oriented to person, place, and time.  Cranial Nerves II-XII grossly intact  Skin: Skin is warm and dry. No rash noted.  Psychiatric: She has a normal mood and affect.  Nursing note and vitals reviewed.   ED Course  Procedures (including critical care time) Labs Review Labs Reviewed  COMPREHENSIVE METABOLIC PANEL - Abnormal; Notable for the following:    Glucose, Bld 108 (*)    AST 14 (*)    ALT <5 (*)    Total Bilirubin 0.2 (*)    All other components within normal limits  LIPASE, BLOOD  CBC  URINALYSIS, ROUTINE W REFLEX MICROSCOPIC (NOT AT [facility])  I-STAT BETA HCG BLOOD, ED ([facility], [facility], [facility] ONLY)    Imaging Review Ct Angio Chest Pe W/cm &/or Wo Cm  02/25/2016  CLINICAL DATA:  45 year old female with left flank pain. Abnormal CT the abdomen and pelvis demonstrating a masslike opacity in the periphery of the right lower lobe. Evaluate for potential pulmonary embolism. EXAM: CT ANGIOGRAPHY CHEST WITH CONTRAST TECHNIQUE: Multidetector CT imaging of the chest was performed using the standard protocol during bolus administration of intravenous contrast. Multiplanar CT image reconstructions and MIPs were obtained to evaluate the vascular anatomy. CONTRAST:  100 mL of Isovue 370. COMPARISON:  None. FINDINGS: Mediastinum/Lymph Nodes: There is exclusion of a subsegmental sized pulmonary artery branch extending to the periphery of the right lower lobe in the area of the previously noted mass-like opacity, best demonstrated on images 119-125 of series 12. Heart size is normal. There is no significant pericardial fluid, thickening or pericardial calcification. No pathologically enlarged mediastinal or hilar lymph nodes. Esophagus is unremarkable in appearance. No axillary lymphadenopathy. Lungs/Pleura: 4.1 x 2.3 cm mass-like opacity in the periphery of the right lower lobe (image 58 of series  11) with surrounding ground-glass attenuation. No other suspicious appearing pulmonary nodules or masses are noted. There is a small amount of linear scarring in the base of the left lower lobe. Trace right pleural effusion lying dependently. Upper Abdomen: Unremarkable. Musculoskeletal/Soft Tissues: There are no aggressive appearing lytic or blastic lesions noted in the visualized portions of the skeleton. Review of the MIP images confirms the above findings. IMPRESSION: 1. Complete occlusion of a tiny subsegmental sized branch to the right lower lobe which does correspond to the mass-like opacity. This is presumably a right lower lobe pulmonary infarction, however, such a large infarct from such a small vascular branch is somewhat unusual. Clinical correlation to exclude signs are symptoms of atypical infection (i.e., fungal pneumonia) should also be considered. 2. Trace right pleural effusion lying dependently. These results were called by telephone at the time of interpretation on 02/25/2016 at 11:48 pm to PA [facility] , who verbally acknowledged these results. Electronically  Signed   By: Trudie Reedaniel  Entrikin M.D.   On: 02/25/2016 23:49   Ct Renal Stone Study  02/25/2016  CLINICAL DATA:  45 year old female with history of left lower quadrant abdominal pain since yesterday. No history of kidney stones. EXAM: CT ABDOMEN AND PELVIS WITHOUT CONTRAST TECHNIQUE: Multidetector CT imaging of the abdomen and pelvis was performed following the standard protocol without IV contrast. COMPARISON:  No priors. FINDINGS: Lower chest: In the periphery of the right lower lobe there is an incompletely visualized pleural-based mass-like opacity measuring 3.7 x 2.4 cm (image 3 of series 6), with some surrounding ground-glass attenuation in the adjacent lung parenchyma. Hepatobiliary: No definite cystic or solid hepatic lesions are identified on today's noncontrast CT examination. Multiple tiny gallstones filling a gallbladder  which is completely decompressed. Pancreas: No definite pancreatic mass or peripancreatic inflammatory changes on today's noncontrast CT examination. Spleen: Unremarkable. Adrenals/Urinary Tract: 3 mm nonobstructive calculus in the upper pole collecting system of the left kidney. No additional calculi are noted within the collecting system of the right kidney, along the course of either ureter, or within the lumen of the urinary bladder. There is no hydroureteronephrosis or perinephric stranding to indicate urinary tract obstruction at this time. 11 mm low-attenuation lesion in the interpolar region of the right kidney is incompletely characterized on today's noncontrast CT examination, but is statistically likely a small cyst. Bilateral adrenal glands are normal in appearance. The unenhanced appearance of the urinary bladder is normal. Stomach/Bowel: The appearance of the stomach is normal. There is no pathologic dilatation of small bowel or colon. Normal appendix. Vascular/Lymphatic: No significant atherosclerotic disease or definite aneurysm identified in the abdominal or pelvic vasculature. No lymphadenopathy noted in the abdomen or pelvis on today's noncontrast CT examination. Reproductive: Uterus and ovaries are unremarkable in appearance. Other: No significant volume of ascites.  No pneumoperitoneum. Musculoskeletal: There are no aggressive appearing lytic or blastic lesions noted in the visualized portions of the skeleton. IMPRESSION: 1. 3.7 x 2.4 cm pleural-based mass-like opacity in the right lower lobe with surrounding ground-glass attenuation. Based on comparison with prior chest x-ray 01/03/2016, this finding appears to be new. Given the rapid development of overall appearance of this finding, this is highly suspicious for potential pulmonary infarct and associated pulmonary hemorrhage. Clinical correlation is suggested, and if there is any clinical concern for pulmonary embolism, further evaluation  with PE protocol CT scan would be strongly recommended at this time. 2. 3 mm nonobstructive calculus in the upper pole collecting system of left kidney. No ureteral stones or findings of urinary tract obstruction are noted at this time. 3. Cholelithiasis without evidence of acute cholecystitis at this time. 4. Normal appendix. 5. Additional incidental findings, as above. These results were called by telephone at the time of interpretation on 02/25/2016 at 9:38 pm to Dr. Joycie PeekBENJAMIN Alphonsa Brickle, who verbally acknowledged these results. Electronically Signed   By: Trudie Reedaniel  Entrikin M.D.   On: 02/25/2016 21:46   I have personally reviewed and evaluated these images and lab results as part of my medical decision-making.   EKG Interpretation None     Meds given in ED:  Medications  iopamidol (ISOVUE-370) 76 % injection 100 mL (100 mLs Intravenous Contrast Given 02/25/16 2204)    Discharge Medication List as of 02/26/2016 12:04 AM    START taking these medications   Details  Rivaroxaban 15 & 20 MG TBPK Take as directed on package: Start with one 15mg  tablet by mouth twice a day with food. On  Day 22, switch to one  tablet once a day with food., Print       Filed Vitals:   02/25/16 1755 02/25/16 2004 02/25/16 2327  BP: 117/79 125/70 127/89  Pulse: 103 101 91  Temp: 98.2 F (36.8 C) 98.4 F (36.9 C)   TempSrc:  Oral   Resp: SpO2: 100% 98% 100%    MDM  Danelle Curiale Dark-Johnson is a 45 y.o. female who presents for evaluation of left abdominal flank pain. Presentation concerning for kidney stone. Obtained a CT renal stone study. Findings show no evidence of intra-abdominal pathology, but expressed concern for possible pulmonary embolus. Obtained a CT angiogram of chest that showed right subsegmental PE. PE is not clinically significant, Patient remains 100% on room air, heart rate is 91, denies any chest pain, cough, shortness of breath. No other risk factors for PE. No history of HIV.  She reports she has a follow-up appointment with her PCP, Dr. Ginger Organ on Tuesday. Plan to DC with Xarelto starter pack. Patient is comfortable with this plan as well as subsequent discharge. Discussed strict return precautions. She verbalized understanding and is amenable to discharge. Prior to patient discharge, I discussed and reviewed this case with Dr. Juleen China   Final diagnoses:  Left flank pain  Other pulmonary embolism without acute cor pulmonale, unspecified chronicity [facility])       Joycie Peek, PA-C 02/26/16 1610  Raeford Razor, MD 03/01/16 1344

## 2016-02-25 NOTE — ED Notes (Signed)
Per EMS-states left lower abdominal pain since yesterday-no N/V-not sure if she is pregnant

## 2016-02-26 MED ORDER — RIVAROXABAN (XARELTO) VTE STARTER PACK (15 & 20 MG): ORAL_TABLET | ORAL | Status: DC

## 2016-02-26 NOTE — Discharge Instructions (Signed)
There does not appear to be an emergent cause for your abdominal pain at this time. You were found to have a small blood clot in your right lung called a pulmonary embolus. You will be treated for this with blood thinners, Xarelto. Please take medications as prescribed. Follow-up with your doctor for regularly scheduled appointment on Tuesday for reevaluation. Return to ED for any new or worsening symptoms as we discussed.  Pulmonary Embolism A pulmonary embolism (PE) is a sudden blockage or decrease of blood flow in one lung or both lungs. Most blockages come from a blood clot that travels from the legs or the pelvis to the lungs. PE is a dangerous and potentially life-threatening condition if it is not treated right away. CAUSES A pulmonary embolism occurs most commonly when a blood clot travels from one of your veins to your lungs. Rarely, PE is caused by air, fat, amniotic fluid, or part of a tumor traveling through your veins to your lungs. RISK FACTORS A PE is more likely to develop in:  People who smoke.  People who areolder, especially over 74 years of age.  People who are overweight (obese).  People who sit or lie still for a long time, such as during long-distance travel (over 4 hours), bed rest, hospitalization, or during recovery from certain medical conditions like a stroke.  People who do not engage in much physical activity (sedentary lifestyle).  People who have chronic breathing disorders.  People whohave a personal or family history of blood clots or blood clotting disease.  People whohave peripheral vascular disease (PVD), diabetes, or some types of cancer.  People who haveheart disease, especially if the person had a recent heart attack or has congestive heart failure.  People who have neurological diseases that affect the legs (leg paresis).  People who have had a traumatic injury, such as breaking a hip or leg.  People whohave recently had major or lengthy  surgery, especially on the hip, knee, or abdomen.  People who have hada central line placed inside a large vein.  People who takemedicines that contain the hormone estrogen. These include birth control pills and hormone replacement therapy.  Pregnancy or during childbirth or the postpartum period. SIGNS AND SYMPTOMS  The symptoms of a PE usually start suddenly and include:  Shortness of breath while active or at rest.  Coughing or coughing up blood or blood-tinged mucus.  Chest pain that is often worse with deep breaths.  Rapid or irregular heartbeat.  Feeling light-headed or dizzy.  Fainting.  Feelinganxious.  Sweating. There may also be pain and swelling in a leg if that is where the blood clot started. These symptoms may represent a serious problem that is an emergency. Do not wait to see if the symptoms will go away. Get medical help right away. Call your local emergency services (911 in the [place] Do not drive yourself to the hospital. DIAGNOSIS Your health care provider will take a medical history and perform a physical exam. You may also have other tests, including:  Blood tests to assess the clotting properties of your blood, assess oxygen levels in your blood, and find blood clots.  Imaging tests, such as CT, ultrasound, MRI, X-ray, and other tests to see if you have clots anywhere in your body.  An electrocardiogram (ECG) to look for heart strain from blood clots in the lungs. TREATMENT The main goals of PE treatment are:  To stop a blood clot from growing larger.  To stop new blood  clots from forming. The type of treatment that you receive depends on many factors, such as the cause of your PE, your risk for bleeding or developing more clots, and other medical conditions that you have. Sometimes, a combination of treatments is necessary. This condition may be treated with:  Medicines, including newer oral blood thinners (anticoagulants), warfarin, low  molecular weight heparins, thrombolytics, or heparins.  Wearing compression stockings or using different types of devices.  Surgery (rare) to remove the blood clot or to place a filter in your abdomen to stop the blood clot from traveling to your lungs. Treatments for a PE are often divided into immediate treatment, long-term treatment (up to 3 months after PE), and extended treatment (more than 3 months after PE). Your treatment may continue for several months. This is called maintenance therapy, and it is used to prevent the forming of new blood clots. You can work with your health care provider to choose the treatment program that is best for you. What are anticoagulants? Anticoagulants are medicines that treat PEs. They can stop current blood clots from growing and stop new clots from forming. They cannot dissolve existing clots. Your body dissolves clots by itself over time. Anticoagulants are given by mouth, by injection, or through an IV tube. What are thrombolytics? Thrombolytics are clot-dissolving medicines that are used to dissolve a PE. They carry a high risk of bleeding, so they tend to be used only in severe cases or if you have very low blood pressure. HOME CARE INSTRUCTIONS If you are taking a newer oral anticoagulant:  Take the medicine every single day at the same time each day.  Understand what foods and drugs interact with this medicine.  Understand that there are no regular blood tests required when using this medicine.  Understandthe side effects of this medicine, including excessive bruising or bleeding. Ask your health care provider or pharmacist about other possible side effects. If you are taking warfarin:  Understand how to take warfarin and know which foods can affect how warfarin works in Public relations account executiveyour body.  Understand that it is dangerous to taketoo much or too little warfarin. Too much warfarin increases the risk of bleeding. Too little warfarin continues to allow  the risk for blood clots.  Follow your PT and INR blood testing schedule. The PT and INR results allow your health care provider to adjust your dose of warfarin. It is very important that you have your PT and INR tested as often as told by your health care provider.  Avoid major changes in your diet, or tell your health care provider before you change your diet. Arrange a visit with a registered dietitian to answer your questions. Many foods, especially foods that are high in vitamin K, can interfere with warfarin and affect the PT and INR results. Eat a consistent amount of foods that are high in vitamin K, such as:  Spinach, kale, broccoli, cabbage, collard greens, turnip greens, Brussels sprouts, peas, cauliflower, seaweed, and parsley.  Beef liver and pork liver.  Green tea.  Soybean oil.  Tell your health care provider about any and all medicines, vitamins, and supplements that you take, including aspirin and other over-the-counter anti-inflammatory medicines. Be especially cautious with aspirin and anti-inflammatory medicines. Do not take those before you ask your health care provider if it is safe to do so. This is important because many medicines can interfere with warfarin and affect the PT and INR results.  Do not start or stop taking any over-the-counter  or prescription medicine unless your health care provider or pharmacist tells you to do so. If you take warfarin, you will also need to do these things:  Hold pressure over cuts for longer than usual.  Tell your dentist and other health care providers that you are taking warfarin before you have any procedures in which bleeding may occur.  Avoid alcohol or drink very small amounts. Tell your health care provider if you change your alcohol intake.  Do not use tobacco products, including cigarettes, chewing tobacco, and e-cigarettes. If you need help quitting, ask your health care provider.  Avoid contact sports. General  Instructions  Take over-the-counter and prescription medicines only as told by your health care provider. Anticoagulant medicines can have side effects, including easy bruising and difficulty stopping bleeding. If you are prescribed an anticoagulant, you will also need to do these things:  Hold pressure over cuts for longer than usual.  Tell your dentist and other health care providers that you are taking anticoagulants before you have any procedures in which bleeding may occur.  Avoid contact sports.  Wear a medical alert bracelet or carry a medical alert card that says you have had a PE.  Ask your health care provider how soon you can go back to your normal activities. Stay active to prevent new blood clots from forming.  Make sure to exercise while traveling or when you have been sitting or standing for a long period of time. It is very important to exercise. Exercise your legs by walking or by tightening and relaxing your leg muscles often. Take frequent walks.  Wear compression stockings as told by your health care provider to help prevent more blood clots from forming.  Do not use tobacco products, including cigarettes, chewing tobacco, and e-cigarettes. If you need help quitting, ask your health care provider.  Keep all follow-up appointments with your health care provider. This is important. PREVENTION Take these actions to decrease your risk of developing another PE:  Exercise regularly. For at least 30 minutes every day, engage in:  Activity that involves moving your arms and legs.  Activity that encourages good blood flow through your body by increasing your heart rate.  Exercise your arms and legs every hour during long-distance travel (over 4 hours). Drink plenty of water and avoid drinking alcohol while traveling.  Avoid sitting or lying in bed for long periods of time without moving your legs.  Maintain a weight that is appropriate for your height. Ask your health  care provider what weight is healthy for you.  If you are a woman who is over 38 years of age, avoid unnecessary use of medicines that contain estrogen. These include birth control pills.  Do not smoke, especially if you take estrogen medicines. If you need help quitting, ask your health care provider.  If you are at very high risk for PE, wear compression stockings.  If you recently had a PE, have regularly scheduled ultrasound testing on your legs to check for new blood clots. If you are hospitalized, prevention measures may include:  Early walking after surgery, as soon as your health care provider says that it is safe.  Receiving anticoagulants to prevent blood clots. If you cannot take anticoagulants, other options may be available, such as wearing compression stockings or using different types of devices. SEEK IMMEDIATE MEDICAL CARE IF:  You have new or increased pain, swelling, or redness in an arm or leg.  You have numbness or tingling in an arm  or leg.  You have shortness of breath while active or at rest.  You have chest pain.  You have a rapid or irregular heartbeat.  You feel light-headed or dizzy.  You cough up blood.  You notice blood in your vomit, bowel movement, or urine.  You have a fever. These symptoms may represent a serious problem that is an emergency. Do not wait to see if the symptoms will go away. Get medical help right away. Call your local emergency services (911 in the [place] Do not drive yourself to the hospital.   This information is not intended to replace advice given to you by your health care provider. Make sure you discuss any questions you have with your health care provider.   Document Released: 10/13/2000 Document Revised: 07/07/2015 Document Reviewed: 02/10/2015 [employer] Interactive Patient Education [employer]

## 2016-04-03 ENCOUNTER — Other Ambulatory Visit
Admission: RE | Admit: 2016-04-03 | Discharge: 2016-04-03 | Disposition: A | Discharge status: Home / Self Care | Payer: Medicaid Other | Source: Ambulatory Visit | Attending: Pulmonary Disease | Admitting: Pulmonary Disease

## 2016-04-03 ENCOUNTER — Encounter: Payer: Self-pay | Admitting: Pulmonary Disease

## 2016-04-03 ENCOUNTER — Ambulatory Visit
Admission: RE | Admit: 2016-04-03 | Discharge: 2016-04-03 | Disposition: A | Discharge status: Home / Self Care | Payer: Medicaid Other | Source: Ambulatory Visit | Attending: Pulmonary Disease | Admitting: Pulmonary Disease

## 2016-04-03 ENCOUNTER — Ambulatory Visit (INDEPENDENT_AMBULATORY_CARE_PROVIDER_SITE_OTHER): Payer: Medicaid Other | Admitting: Pulmonary Disease

## 2016-04-03 VITALS — BP 130/82 | HR 90 | Ht 63.0 in | Wt 130.0 lb

## 2016-04-03 DIAGNOSIS — I2699 Other pulmonary embolism without acute cor pulmonale: Secondary | ICD-10-CM

## 2016-04-03 DIAGNOSIS — R918 Other nonspecific abnormal finding of lung field: Secondary | ICD-10-CM | POA: Diagnosis not present

## 2016-04-03 LAB — FIBRIN DERIVATIVES D-DIMER (ARMC ONLY): FIBRIN DERIVATIVES D-DIMER (ARMC): 165 (ref 0–499)

## 2016-04-04 NOTE — Progress Notes (Signed)
PULMONARY CONSULT NOTE  [facility] Date of initial consultation: 04/03/16 Reason for consultation: recent PE  PT PROFILE: 45 y.o. F with no prior pulmonary history presented to ED 02/25/16 with LLQ and L flank pain. She underwent CT of abd/pelvis to evaluate for kidney stone. There was no stone but incidentally noted was a RLL opacity in the R costophrenic angle which was interpreted as a pulmonary infarction. This prompted a CTA chest which was interpreted as revealing a very small filling defect in a subsegmental artery to the RLL. She has been on rivaroxaban since then. She identifies not VTE risk factors.   INITIAL IMPRESSION: 1) suspected PE - the official interpretation of the CTA chest notwithstanding, I am not entirely convinced that a pulmonary embolism is demonstrated. If so, it is extremely small and would be unlikely to account for the pulmonary opacity that has been interpreted as a pulmonary infarction. If this is truly a PE, there are no identifiable RFs. She is on depo-provera but this has not been shown to be associated with an increased risk of VTE  2) RLL opacity - This was not demonstrated on CXR in March of 2017. The appearance is more suggestive of round atelectasis than of pulmonary infarction INITIAL PLAN: 1) check hypercoagulation panel 2) check D-dimer as there is some evidence that persistent elevation of D-dimer is associated with increased risk of recurrent embolism 3) Complete 6 months of rivaroxaban as there is no way to definitively prove that the CTA chest findings were not a small PE @ this time 4) CXR today to evaluate RLL opacity 5) ROV 3 months  HPI:  History as above. The abdominal pain has resolved. She has no respiratory or chest symptoms. She denies LE edema and calf tenderness  Past Medical History  Diagnosis Date  . Headache(784.0)   . Abnormal Pap smear   . Gonorrhea   . Trichomonas   . Depression    bipolar  . Anxiety     Past Surgical History  Procedure Laterality Date  . Eye surgery Left 1977  . Gynecologic cryosurgery    . Eye surgery    . Cesarean section  01/12/2012    Procedure: CESAREAN SECTION;  Surgeon: Adam PhenixJames G Arnold, MD;  Location: [facility];  Service: Gynecology;  Laterality: N/A;    MEDICATIONS: I have reviewed all medications and confirmed regimen as documented  Social History   Social History  . Marital Status: Legally Separated    Spouse Name: N/A  . Number of Children: N/A  . Years of Education: N/A   Occupational History  . Not on file.   Social History Main Topics  . Smoking status: Former Smoker -- 0.25 packs/day  . Smokeless tobacco: Never Used     Comment: quit in around 1993  . Alcohol Use: No  . Drug Use: No  . Sexual Activity: Yes   Other Topics Concern  . Not on file   Social History Narrative    Family History  Problem Relation Age of Onset  . Anesthesia problems Neg Hx   . Hypotension Neg Hx   . Pseudochol deficiency Neg Hx   . Malignant hyperthermia Neg Hx   . Diabetes Mother   . Hypertension Mother   . Diabetes Maternal Grandmother   . Kidney disease Maternal Grandmother     ROS: No fever, myalgias/arthralgias, unexplained weight loss or weight gain No new focal weakness or sensory deficits No otalgia, hearing loss, visual changes,  nasal and sinus symptoms, mouth and throat problems No neck pain or adenopathy No abdominal pain, N/V/D, diarrhea, change in bowel pattern No dysuria, change in urinary pattern No LE edema or calf tenderness   Filed Vitals:   04/03/16 1014  BP: 130/82  Pulse: 90  Height:  (1.6 m)  Weight: 130 lb (58.968 kg)  SpO2: 98%     EXAM:  Gen: WDWN, No overt respiratory distress HEENT: NCAT, TMs and canals normal, sclera white, nares and nasal mucosa normal, oropharynx normal Neck: Supple without LAN, thyromegaly, JVD Lungs: breath sounds full, percussion note normal throughout, No  adventitious sounds Cardiovascular: Normal rate, reg rhythm, no murmurs noted Abdomen: Soft, nontender, normal BS Ext: without clubbing, cyanosis, edema Neuro: CNs grossly intact, motor and sensory intact, DTRs symmetric Skin: Limited exam, no lesions noted  DATA:   BMP Latest Ref Rng 02/25/2016 01/03/2016 03/21/2011  Glucose 65 - 99 mg/dL 161(W) 85 960(A)  BUN 6 - 20 mg/dL Creatinine 0.44 - 1.00 mg/dL 5.40 9.81 <1.91  Sodium 135 - 145 mmol/L 138 134(L) 135  Potassium 3.5 - 5.1 mmol/L 3.8 3.9 4.7 SLIGHT HEMOLYSIS  Chloride 101 - 111 mmol/L 105 107 102  CO2 22 - 32 mmol/L 24 20(L) 24  Calcium 8.9 - 10.3 mg/dL 9.3 9.2 9.1    CBC Latest Ref Rng 02/25/2016 01/03/2016 01/13/2012  WBC 4.0 - 10.5 K/uL 5.7 5.6 17.3(H)  Hemoglobin 12.0 - 15.0 g/dL 47.8 29.5 9.0(L)  Hematocrit 36.0 - 46.0 % 39.4 43.3 26.5(L)  Platelets 150 - 400 K/uL 363 281 177    CXR:    IMPRESSION: 1) suspected PE - the official interpretation of the CTA chest notwithstanding, I am not entirely convinced that a pulmonary embolism is demonstrated. If so, it is extremely small and would be unlikely to account for the pulmonary opacity that has been interpreted as a pulmonary infarction. If this is truly a PE, there are no identifiable RFs. She is on depo-provera but this has not been shown to be associated with an increased risk of VTE  2) RLL opacity - This was not demonstrated on CXR in March of 2017. The appearance is more suggestive of round atelectasis than of pulmonary infarction  PLAN: 1) check hypercoagulation panel 2) check D-dimer as there is some evidence that persistent elevation of D-dimer is associated with increased risk of recurrent embolism 3) Complete 6 months of rivaroxaban as there is no way to definitively prove that the CTA chest findings were not a small PE @ this time 4) CXR today to evaluate RLL opacity 5) ROV 3 months   Billy Fischer, MD PCCM service Mobile [phone] Pager  [phone] 04/04/2016

## 2016-04-12 LAB — VENOUS THROMB. PATIENTS ON VKA
APTT: 31.6 s — ABNORMAL HIGH
AT III ACT/NOR PPP CHRO: 104 %
Anticardiolipin Ab, IgG: 70 [GPL'U] — ABNORMAL HIGH
BETA-2 GLYCOPROTEIN I, IGA: 55 SAU — AB
Beta-2 Glycoprotein I, IgG: 26 SGU — ABNORMAL HIGH
Beta-2 Glycoprotein I, IgM: >150 SMU — ABNORMAL HIGH
DRVVT CONFIRM SECONDS: 52.1 s
DRVVT RATIO: 2.6 ratio — AB
DRVVT Screen Seconds: 164.7 s — ABNORMAL HIGH
FACTOR VII ANTIGEN: 90 %
FACTOR VIII ACTIVITY: 109 %
HEXAGONAL PHOSPHOLIPID NEUTRAL: 29 s — AB
HOMOCYSTEINE: 9.8 umol/L
PROT S AG ACT/NOR PPP IMM: 69 %
Prot C Ag Act/Nor PPP Imm: 85 %
Protein C Ag/FVII Ag Ratio**: 0.9 ratio
Protein S Ag/FVII Ag Ratio**: 0.8 ratio

## 2016-04-19 ENCOUNTER — Institutional Professional Consult (permissible substitution): Payer: Medicaid Other | Admitting: Pulmonary Disease

## 2016-07-07 ENCOUNTER — Ambulatory Visit
Admission: RE | Admit: 2016-07-07 | Discharge: 2016-07-07 | Disposition: A | Discharge status: Home / Self Care | Payer: Medicaid Other | Source: Ambulatory Visit | Attending: Pulmonary Disease | Admitting: Pulmonary Disease

## 2016-07-07 ENCOUNTER — Ambulatory Visit (INDEPENDENT_AMBULATORY_CARE_PROVIDER_SITE_OTHER): Payer: Medicaid Other | Admitting: Pulmonary Disease

## 2016-07-07 ENCOUNTER — Encounter: Payer: Self-pay | Admitting: Pulmonary Disease

## 2016-07-07 ENCOUNTER — Telehealth: Payer: Self-pay | Admitting: Pulmonary Disease

## 2016-07-07 ENCOUNTER — Telehealth: Payer: Self-pay | Admitting: *Deleted

## 2016-07-07 VITALS — BP 118/62 | HR 78 | Ht 64.0 in | Wt 139.0 lb

## 2016-07-07 DIAGNOSIS — R918 Other nonspecific abnormal finding of lung field: Secondary | ICD-10-CM | POA: Diagnosis not present

## 2016-07-07 DIAGNOSIS — I2699 Other pulmonary embolism without acute cor pulmonale: Secondary | ICD-10-CM | POA: Diagnosis not present

## 2016-07-07 DIAGNOSIS — R0600 Dyspnea, unspecified: Secondary | ICD-10-CM | POA: Diagnosis not present

## 2016-07-07 NOTE — Progress Notes (Signed)
PULMONARY OFFICE FOLLOW UP NOTE  Requesting MD/Service: [facility] Date of initial consultation: 04/03/16 Reason for consultation: recent PE  PT PROFILE: 45 y.o. F with no prior pulmonary history presented to ED 02/25/16 with LLQ and L flank pain. She underwent CT of abd/pelvis to evaluate for kidney stone. There was no stone but incidentally noted was a RLL opacity in the R costophrenic angle which was interpreted as a pulmonary infarction. This prompted a CTA chest which was interpreted as revealing a very small filling defect in a subsegmental artery to the RLL. She has been on rivaroxaban since then. She identifies not VTE risk factors.   INITIAL IMPRESSION: 1) suspected PE - the official interpretation of the CTA chest notwithstanding, I am not entirely convinced that a pulmonary embolism is demonstrated. If so, it is extremely small and would be unlikely to account for the pulmonary opacity that has been interpreted as a pulmonary infarction. If this is truly a PE, there are no identifiable RFs. She is on depo-provera but this has not been shown to be associated with an increased risk of VTE  2) RLL opacity - This was not demonstrated on CXR in March of 2017. The appearance is more suggestive of round atelectasis than of pulmonary infarction INITIAL PLAN: 1) check hypercoagulation panel 2) check D-dimer as there is some evidence that persistent elevation of D-dimer is associated with increased risk of recurrent embolism 3) Complete 6 months of rivaroxaban as there is no way to definitively prove that the CTA chest findings were not a small PE @ this time 4) CXR today to evaluate RLL opacity 5) ROV 3 months  ROV 07/07/16: No new problems. CXR revealed resolution of RLL opacity  SUBJ: Remains on Rivaroxaban without complications. Continues to hav mild, intermittent DOE. Denies CP, fever, purulent sputum, hemoptysis, LE edema and calf tenderness   Vitals:   07/07/16 0901   BP: 118/62  Pulse: 78  SpO2: 100%  Weight: 139 lb (63 kg)  Height: 5\' 4"  (1.626 m)     EXAM:  Gen: NAD HEENT: WNL Neck: Supple without LAN, JVD Lungs: breath sounds full, No adventitious sounds Cardiovascular: Reg, no murmurs noted Abdomen: Soft, nontender, normal BS Ext: without clubbing, cyanosis, edema Neuro: grossly intact  DATA:   BMP Latest Ref Rng & Units 02/25/2016 01/03/2016 03/21/2011  Glucose 65 - 99 mg/dL 161(W) 85 960(A)  BUN 6 - 20 mg/dL 10 7 10   Creatinine 0.44 - 1.00 mg/dL 5.40 9.81 <1.91  Sodium 135 - 145 mmol/L 138 134(L) 135  Potassium 3.5 - 5.1 mmol/L 3.8 3.9 4.7 SLIGHT HEMOLYSIS  Chloride 101 - 111 mmol/L 105 107 102  CO2 22 - 32 mmol/L 24 20(L) 24  Calcium 8.9 - 10.3 mg/dL 9.3 9.2 9.1    CBC Latest Ref Rng & Units 02/25/2016 01/03/2016 01/13/2012  WBC 4.0 - 10.5 K/uL 5.7 5.6 17.3(H)  Hemoglobin 12.0 - 15.0 g/dL 47.8 29.5 9.0(L)  Hematocrit 36.0 - 46.0 % 39.4 43.3 26.5(L)  Platelets 150 - 400 K/uL 363 281 177    CXR:  Resolution on opacity in R costophrenic angle  IMPRESSION: 1) suspected PE - the official interpretation of the CTA chest notwithstanding, I am not entirely convinced that a pulmonary embolism was demonstrated. Nonetheless, since she had been started on anticoagulation and since she was deemed low risk for bleeding complications, anticoagulation was continued with plan of 6 months total (through Oct 2017). A hypercoagulation panel was normal except the LA/APLA measurements  that can be affected by rivaroxaban. These will have to be repeated 2) RLL opacity - resolved on current film 3) Mild dyspnea of unclear etiology  PLAN: 1) Complete 6 months of anticoagulation (through October) 2) ROV mid November with PFTs and we will repeat hyper coag panel @ that time   Billy Fischeravid Antonio Woodhams, MD PCCM service Mobile [phone] Pager [phone] 07/07/2016

## 2016-07-07 NOTE — Telephone Encounter (Signed)
Informed pt that I had already sent a message to DS informing pt couldn't do test and to advise from this. Informed pt I would call with any further recs.

## 2016-07-07 NOTE — Patient Instructions (Signed)
3 problems addressed today 1) Abnormality on your CXR 2) Question of blood clot in your lungs 3) shortness of breath  To address these problems 1) Chest Xray today 2) Lung function tests 3) continue blood thinner medicine through October of this year, then stop   When you return in mid-November, we will repeat your blood clotting tests

## 2016-07-07 NOTE — Telephone Encounter (Signed)
Pt came in to do PFT and gave little effort. When trying pre spirometry pt would not blow out constantly. Gave multiple instructions on how to perform test and pt gave little effort and said she can't do it.    FYI

## 2016-07-07 NOTE — Telephone Encounter (Signed)
Patient asks if there is anything else she can do since she couldn't finish her breathing test this morning. Please call and advise

## 2016-07-10 NOTE — Telephone Encounter (Signed)
Noted  

## 2016-07-13 ENCOUNTER — Ambulatory Visit: Payer: Self-pay

## 2016-07-13 ENCOUNTER — Other Ambulatory Visit: Payer: Self-pay | Admitting: Occupational Medicine

## 2016-07-13 DIAGNOSIS — M79645 Pain in left finger(s): Secondary | ICD-10-CM

## 2016-09-04 ENCOUNTER — Ambulatory Visit: Payer: Medicaid Other | Admitting: Pulmonary Disease

## 2016-09-08 ENCOUNTER — Telehealth: Payer: Self-pay | Admitting: Pulmonary Disease

## 2016-09-08 NOTE — Telephone Encounter (Signed)
Received records request Disability Determination Services, forwarded to [employer] for processing.  

## 2016-09-29 ENCOUNTER — Encounter (HOSPITAL_COMMUNITY): Payer: Self-pay

## 2016-09-29 ENCOUNTER — Emergency Department (HOSPITAL_COMMUNITY): Payer: Medicaid Other

## 2016-09-29 ENCOUNTER — Emergency Department (HOSPITAL_COMMUNITY)
Admission: EM | Admit: 2016-09-29 | Discharge: 2016-09-29 | Disposition: A | Discharge status: Home / Self Care | Payer: Medicaid Other | Attending: Emergency Medicine | Admitting: Emergency Medicine

## 2016-09-29 DIAGNOSIS — Z7901 Long term (current) use of anticoagulants: Secondary | ICD-10-CM | POA: Diagnosis not present

## 2016-09-29 DIAGNOSIS — Z87891 Personal history of nicotine dependence: Secondary | ICD-10-CM | POA: Insufficient documentation

## 2016-09-29 DIAGNOSIS — J029 Acute pharyngitis, unspecified: Secondary | ICD-10-CM | POA: Diagnosis present

## 2016-09-29 DIAGNOSIS — R6889 Other general symptoms and signs: Secondary | ICD-10-CM

## 2016-09-29 DIAGNOSIS — Z79899 Other long term (current) drug therapy: Secondary | ICD-10-CM | POA: Diagnosis not present

## 2016-09-29 DIAGNOSIS — J111 Influenza due to unidentified influenza virus with other respiratory manifestations: Secondary | ICD-10-CM | POA: Diagnosis not present

## 2016-09-29 LAB — RAPID STREP SCREEN (MED CTR MEBANE ONLY): STREPTOCOCCUS, GROUP A SCREEN (DIRECT): NEGATIVE

## 2016-09-29 MED ORDER — OSELTAMIVIR PHOSPHATE 75 MG PO CAPS: 75.0000 mg | ORAL_CAPSULE | Freq: Two times a day (BID) | ORAL | 0 refills | Status: DC

## 2016-09-29 MED ORDER — HYDROCOD POLST-CPM POLST ER 10-8 MG/5ML PO SUER: 5.0000 mL | Freq: Two times a day (BID) | ORAL | 0 refills | Status: DC | PRN

## 2016-09-29 MED ORDER — ACETAMINOPHEN 325 MG PO TABS
650.0000 mg | ORAL_TABLET | Freq: Once | ORAL | Status: AC
  Administered 2016-09-29: 650 mg via ORAL
  Filled 2016-09-29: qty 2

## 2016-09-29 NOTE — ED Notes (Signed)
Patient taken to XRAY

## 2016-09-29 NOTE — ED Provider Notes (Signed)
[facility]-EMERGENCY DEPT Provider Note   CSN: [id number] Arrival date & time: 09/29/16  1623  By signing my name below, I, Doreatha Martin, attest that this documentation has been prepared under the direction and in the presence of  [facility] M. Damian Leavell, NP. Electronically Signed: Doreatha Martin, ED Scribe. 09/29/16. 5:05 PM.    History   Chief Complaint Chief Complaint  Patient presents with  . Sore Throat    HPI [name] Kirby is a 45 y.o. female who presents to the Emergency Department complaining of moderate sore throat onset 2-3 days ago with associated chills, subjective fever, productive cough with green/yellow phlegm, generalized myalgias, nausea. She also complains of SOB only when coughing. Pt states she has tried Theraflu, Emergen-C, vitamins, Hall's cough drops with little to no relief. Pt states that pain is worsened with swallowing and coughing. Pt reports she was dx with PE in 01/2016 and has been taking Xarelto since. She also has h/o gallstones, for which she is not currently medicated. She denies difficulty tolerating secretions or swallowing, vomiting, diarrhea, abdominal pain, back pain.    The history is provided by the patient. No language interpreter was used.  Sore Throat  The current episode started more than 2 days ago. The problem occurs constantly. The problem has been gradually worsening. Associated symptoms include shortness of breath (with coughing only). Pertinent negatives include no abdominal pain. The symptoms are aggravated by swallowing. The symptoms are relieved by medications. Treatments tried: Theraflu. The treatment provided no relief.    Past Medical History:  Diagnosis Date  . Abnormal Pap smear   . Anxiety   . Depression    bipolar  . Gonorrhea   . Headache(784.0)   . Trichomonas     There are no active problems to display for this patient.   Past Surgical History:  Procedure Laterality Date  . CESAREAN SECTION  01/12/2012   Procedure:  CESAREAN SECTION;  Surgeon: Adam Phenix, MD;  Location: [facility];  Service: Gynecology;  Laterality: N/A;  . EYE SURGERY Left 1977  . EYE SURGERY    . GYNECOLOGIC CRYOSURGERY      OB History    Gravida Para Term Preterm AB Living   3 3 3  0 0 3   SAB TAB Ectopic Multiple Live Births   0 0 0 0 3       Home Medications    Prior to Admission medications   Medication Sig Start Date End Date Taking? Authorizing Provider  chlorpheniramine-HYDROcodone (TUSSIONEX PENNKINETIC ER) 10-8 MG/5ML SUER Take 5 mLs by mouth every 12 (twelve) hours as needed for cough. 09/29/16   Hope Orlene Och, NP  cyclobenzaprine (FLEXERIL) 10 MG tablet Take 10 mg by mouth at bedtime.    Historical Provider, MD  desonide (DESOWEN) 0.05 % lotion Apply 1 application topically 2 (two) times daily. 01/17/16   Historical Provider, MD  esomeprazole (NEXIUM) 20 MG capsule Take 20 mg by mouth daily at 12 noon.    Historical Provider, MD  oseltamivir (TAMIFLU) 75 MG capsule Take 1 capsule (75 mg total) by mouth every 12 (twelve) hours. 09/29/16   Hope Orlene Och, NP  Rivaroxaban 15 & 20 MG TBPK Take as directed on package: Start with one 15mg  tablet by mouth twice a day with food. On Day 22, switch to one 20mg  tablet once a day with food. 02/26/16   Joycie Peek, PA-C  sertraline (ZOLOFT) 50 MG tablet Take 50 mg by mouth daily. Reported on 04/03/2016  Historical Provider, MD  traZODone (DESYREL) 50 MG tablet Take 50 mg by mouth at bedtime.    Historical Provider, MD  ursodiol (ACTIGALL) 300 MG capsule Take 300 mg by mouth 2 (two) times daily.    Historical Provider, MD    Family History Family History  Problem Relation Age of Onset  . Diabetes Mother   . Hypertension Mother   . Diabetes Maternal Grandmother   . Kidney disease Maternal Grandmother   . Anesthesia problems Neg Hx   . Hypotension Neg Hx   . Pseudochol deficiency Neg Hx   . Malignant hyperthermia Neg Hx     Social History Social History  Substance Use  Topics  . Smoking status: Former Smoker    Packs/day: 0.25  . Smokeless tobacco: Never Used     Comment: quit in around 1993  . Alcohol use No     Allergies   Triamcinolone   Review of Systems Review of Systems  Constitutional: Positive for chills and fever.  HENT: Positive for sore throat. Negative for trouble swallowing.   Respiratory: Positive for cough and shortness of breath (with coughing only).   Gastrointestinal: Positive for nausea. Negative for abdominal pain, diarrhea and vomiting.  Musculoskeletal: Positive for myalgias (generalized). Negative for back pain.  All other systems reviewed and are negative.    Physical Exam Updated Vital Signs BP 131/89   Pulse 92   Temp 101.7 F (38.7 C) (Oral)   Resp 16   SpO2 99%   Physical Exam  Constitutional: She appears well-developed and well-nourished.  HENT:  Head: Normocephalic.  Right TM normal. Left ear canal has cerumen. Uvula midline. There is mild erythema. No edema.  Eyes: Conjunctivae and EOM are normal. Pupils are equal, round, and reactive to light. No scleral icterus.  Congenital ptosis of the left lid. No entrapment.   Neck: Normal range of motion. Neck supple.  Cardiovascular: Normal rate and regular rhythm.   Pulmonary/Chest: Effort normal and breath sounds normal. No respiratory distress. She has no wheezes. She has no rales.  Abdominal: She exhibits no distension.  Musculoskeletal: Normal range of motion.  Lymphadenopathy:    She has no cervical adenopathy.  Neurological: She is alert.  Skin: Skin is warm and dry.  Psychiatric: She has a normal mood and affect. Her behavior is normal.  Nursing note and vitals reviewed.    ED Treatments / Results   DIAGNOSTIC STUDIES: Oxygen Saturation is 100% on RA, normal by my interpretation.    COORDINATION OF CARE: 5:02 PM Discussed treatment plan with pt at bedside which includes rapid strep, CXR and pt agreed to plan.    Labs (all labs ordered are  listed, but only abnormal results are displayed) Labs Reviewed  RAPID STREP SCREEN (NOT AT [facility])  CULTURE, GROUP A STREP [facility]    Radiology No results found.  Procedures Procedures (including critical care time)  Medications Ordered in ED Medications  acetaminophen (TYLENOL) tablet 650 mg (650 mg Oral Given 09/29/16 1648)     Initial Impression / Assessment and Plan / ED Course  I have reviewed the triage vital signs and the nursing notes.  Pertinent labs & imaging results that were available during my care of the patient were reviewed by me and considered in my medical decision making (see chart for details).  Clinical Course     Frutoso Chasericole Renee Dark-Johnson presents to the ED for evaluation of cough and congestion with sore throat and fever. Rapid strep and CXR negative. Patient  will be sent home with Tamiflu. . Conservative therapies discussed and recommended. Patient advised to follow up with PCP as needed, or with worsening symptoms. Patient appears stable for discharge at this time. Return precautions discussed and outlined in discharge paperwork. Patient is agreeable to plan.     Final Clinical Impressions(s) / ED Diagnoses   Final diagnoses:  Flu-like symptoms    New Prescriptions Discharge Medication List as of 09/29/2016  5:58 PM    START taking these medications   Details  chlorpheniramine-HYDROcodone (TUSSIONEX PENNKINETIC ER) 10-8 MG/5ML SUER Take 5 mLs by mouth every 12 (twelve) hours as needed for cough., Starting Fri 09/29/2016, Print    oseltamivir (TAMIFLU) 75 MG capsule Take 1 capsule (75 mg total) by mouth every 12 (twelve) hours., Starting Fri 09/29/2016, Print        *I personally performed the services described in this documentation, which was scribed in my presence. The recorded information has been reviewed and is accurate.    [address]Hope AndoverM Neese, NP 10/05/16 0104    Lorre NickAnthony Allen, MD 10/05/16 [phone]

## 2016-09-29 NOTE — ED Notes (Signed)
Patient Alert and oriented X4. Stable and ambulatory. Patient verbalized understanding of the discharge instructions.  Patient belongings were taken by the patient.  

## 2016-10-01 LAB — CULTURE, GROUP A STREP (THRC)

## 2016-10-04 ENCOUNTER — Ambulatory Visit: Payer: Medicaid Other | Admitting: Pulmonary Disease

## 2016-10-09 ENCOUNTER — Telehealth: Payer: Self-pay | Admitting: Pulmonary Disease

## 2016-10-09 NOTE — Telephone Encounter (Signed)
Patient has fu that was r/s to [place]. Per note it says fu cxray but there is no order.

## 2016-10-09 NOTE — Telephone Encounter (Signed)
She actually she had one on 09/29/16, I don't think she will need another one, but he can decide at f/u.

## 2016-10-09 NOTE — Telephone Encounter (Deleted)
She actually had a CXR after her OV. I will place a order for a new one as that has been months ago and we haven't seen her since.

## 2016-10-11 ENCOUNTER — Telehealth: Payer: Self-pay | Admitting: Pulmonary Disease

## 2016-10-11 DIAGNOSIS — R0600 Dyspnea, unspecified: Secondary | ICD-10-CM

## 2016-10-11 DIAGNOSIS — Z86711 Personal history of pulmonary embolism: Secondary | ICD-10-CM

## 2016-10-11 NOTE — Telephone Encounter (Signed)
Pt calling stating she is to have some test (that we ordered in jan)  Is still on eliquis  Would like to know if she is to keep taking them up until then Please call back

## 2016-10-11 NOTE — Telephone Encounter (Signed)
Increased SOB x 2 weeks after being dx with the flu.  She stopped the xarelto back in November.   She has a pending PFT and Appt with DS in jan.  She is also having left lower chest pain and stated that anytime she does any kind of walking--she gets out of breath.  Dr. Belia HemanKasa please advise. Thanks Allergies  Allergen Reactions  . Triamcinolone Itching and Rash

## 2016-10-11 NOTE — Telephone Encounter (Signed)
Needs to be seen ASAP. Assess for PE with CT chest

## 2016-10-11 NOTE — Telephone Encounter (Signed)
Called and spoke with pt and she is aware of appt at 10:45 with Dr. Elvera Lennox in [place].  She is aware that that ct scan has been ordered.  Nothing further is needed.

## 2016-10-12 ENCOUNTER — Ambulatory Visit (INDEPENDENT_AMBULATORY_CARE_PROVIDER_SITE_OTHER): Payer: Medicaid Other | Admitting: Internal Medicine

## 2016-10-12 ENCOUNTER — Encounter: Payer: Self-pay | Admitting: Internal Medicine

## 2016-10-12 VITALS — BP 124/62 | HR 90 | Ht 63.0 in | Wt 148.6 lb

## 2016-10-12 DIAGNOSIS — I2699 Other pulmonary embolism without acute cor pulmonale: Secondary | ICD-10-CM

## 2016-10-12 DIAGNOSIS — J209 Acute bronchitis, unspecified: Secondary | ICD-10-CM

## 2016-10-12 NOTE — Patient Instructions (Signed)
--  Will send for Ct chest to make sure there is no blood clot in the lungs, this should be done ASAP.   --IF this is negative will treat with azithromycin for acute bronchitis.

## 2016-10-12 NOTE — Progress Notes (Signed)
[facility]     Assessment and Plan:  Pulmonary embolism. -History of pulmonary embolism, she is now off of Xarelto with symptoms suggestive of recurrence. Will send for urgent CT of the chest to rule out pulmonary medicine.  Acute bronchitis. -Increased sputum production with cough, suggestive of acute bronchitis. If CT of the chest is negative for pulmonary embolism or pneumonia, will treat with acute bronchitis.   Date: 10/12/2016  MRN# [id number] [name] Kirby 1971/07/23   [name] Kirby is a 45 y.o. old female seen in follow up for chief complaint of  Chief Complaint  Patient presents with  . Acute Visit    DS pt. pt c/o increased sob, wheezing, prod cough with greenish to yellow mucus mainly at night & chest discomfort X 4d.     HPI:   She has been having a persistent cough with chest cold, she has been noticing that she has been out of breath. She has a history of PE, she completed her planned course of xarelto in November.   She feels that she has been more short of breath since that time. She notes that she has some chest pain.  She has occasional coughing, productive of yellow mucus. She notes fever of 101.1 2 weeks ago, she notes that she got lightheaded a few times last week.     Medication:   Outpatient Encounter Prescriptions as of 10/12/2016  Medication Sig  . desonide (DESOWEN) 0.05 % lotion Apply 1 application topically 2 (two) times daily.  Marland Kitchen. esomeprazole (NEXIUM) 20 MG capsule Take 20 mg by mouth daily at 12 noon.  . Rivaroxaban 15 & 20 MG TBPK Take as directed on package: Start with one 15mg  tablet by mouth twice a day with food. On Day 22, switch to one 20mg  tablet once a day with food.  . sertraline (ZOLOFT) 50 MG tablet Take 50 mg by mouth daily. Reported on 04/03/2016  . traZODone (DESYREL) 50 MG tablet Take 50 mg by mouth at bedtime.  . ursodiol (ACTIGALL) 300 MG capsule Take 300 mg by mouth 2 (two) times  daily.  . [DISCONTINUED] chlorpheniramine-HYDROcodone (TUSSIONEX PENNKINETIC ER) 10-8 MG/5ML SUER Take 5 mLs by mouth every 12 (twelve) hours as needed for cough.  . [DISCONTINUED] cyclobenzaprine (FLEXERIL) 10 MG tablet Take 10 mg by mouth at bedtime.  . [DISCONTINUED] oseltamivir (TAMIFLU) 75 MG capsule Take 1 capsule (75 mg total) by mouth every 12 (twelve) hours.   No facility-administered encounter medications on file as of 10/12/2016.      Allergies:  Triamcinolone  Review of Systems: Gen:  Denies  fever, sweats. HEENT: Denies blurred vision. Cvc:  No dizziness,  heaviness Resp:   Denies wheezing Gi: Denies swallowing difficulty, stomach pain. constipation, bowel incontinence Gu:  Denies bladder incontinence, burning urine Ext:   No Joint pain, stiffness. Skin: No skin rash, easy bruising. Endoc:  No polyuria, polydipsia. Psych: No depression, insomnia. Other:  All other systems were reviewed and found to be negative other than what is mentioned in the HPI.   Physical Examination:   VS: BP 124/62 (BP Location: Left Arm, Cuff Size: Normal)   Pulse 90   Ht 5\' 3"  (1.6 m)   Wt 67.4 kg (148 lb 9.6 oz)   SpO2 99%   BMI 26.32 kg/m   General Appearance: No distress  Neuro:without focal findings,  speech normal,  HEENT: PERRLA, EOM intact. Pulmonary: normal breath sounds, No wheezing.   CardiovascularNormal S1,S2.  No m/r/g.  Loud P2 Abdomen: Benign, Soft, non-tender. Renal:  No costovertebral tenderness  GU:  Not performed at this time. Endoc: No evident thyromegaly, no signs of acromegaly. Skin:   warm, no rash. Extremities: normal, no cyanosis, clubbing.   LABORATORY PANEL:   CBC No results for input(s): WBC, HGB, HCT, PLT in the last 168 hours. ------------------------------------------------------------------------------------------------------------------  Chemistries  No results for input(s): NA, K, CL, CO2, GLUCOSE, BUN, CREATININE, CALCIUM, MG, AST, ALT,  ALKPHOS, BILITOT in the last 168 hours.  Invalid input(s): GFRCGP ------------------------------------------------------------------------------------------------------------------  Cardiac Enzymes No results for input(s): TROPONINI in the last 168 hours. ------------------------------------------------------------  RADIOLOGY:   No results found for this or any previous visit. Results for orders placed during the hospital encounter of 09/29/16  DG Chest 2 View   Narrative CLINICAL DATA:  Productive cough with shortness breath and fever for 3 days.  EXAM: CHEST  2 VIEW  COMPARISON:  07/07/2016 and 04/03/2016.  FINDINGS: The heart size and mediastinal contours are normal. The lungs are clear. There is no pleural effusion or pneumothorax. No acute osseous findings are identified.  IMPRESSION: Stable chest.  No active cardiopulmonary process.   Electronically Signed   By: Carey BullocksWilliam  Veazey M.D.   On: 09/29/2016 17:46    ------------------------------------------------------------------------------------------------------------------  Thank  you for allowing [facility] to assist in the care of your patient. Our recommendations are noted above.  Please contact us if we can be of further service.   Wells Guileseep Allisha Harter, MD.  [facility] Office Number: [phone]  Santiago Gladavid Kasa, M.D.  Stephanie AcreVishal Mungal, M.D.  Billy Fischeravid Simonds, M.D  10/12/2016

## 2016-10-13 ENCOUNTER — Ambulatory Visit: Payer: Medicaid Other | Admitting: Pulmonary Disease

## 2016-10-13 ENCOUNTER — Ambulatory Visit
Admission: RE | Admit: 2016-10-13 | Discharge: 2016-10-13 | Disposition: A | Discharge status: Home / Self Care | Payer: Medicaid Other | Source: Ambulatory Visit | Attending: Internal Medicine | Admitting: Internal Medicine

## 2016-10-13 DIAGNOSIS — K8020 Calculus of gallbladder without cholecystitis without obstruction: Secondary | ICD-10-CM | POA: Insufficient documentation

## 2016-10-13 DIAGNOSIS — J8410 Pulmonary fibrosis, unspecified: Secondary | ICD-10-CM | POA: Insufficient documentation

## 2016-10-13 DIAGNOSIS — R0600 Dyspnea, unspecified: Secondary | ICD-10-CM | POA: Diagnosis not present

## 2016-10-13 DIAGNOSIS — Z86711 Personal history of pulmonary embolism: Secondary | ICD-10-CM | POA: Diagnosis present

## 2016-10-13 MED ORDER — IOPAMIDOL (ISOVUE-370) INJECTION 76%
75.0000 mL | Freq: Once | INTRAVENOUS | Status: AC | PRN
  Administered 2016-10-13: 75 mL via INTRAVENOUS

## 2016-10-17 ENCOUNTER — Telehealth: Payer: Self-pay | Admitting: Pulmonary Disease

## 2016-10-17 NOTE — Telephone Encounter (Signed)
Pt calling asking if we can call her back with the CT results that she did last week  Please call back

## 2016-10-17 NOTE — Telephone Encounter (Signed)
Sent to wrong pool  This is pulmonary patient

## 2016-10-17 NOTE — Telephone Encounter (Signed)
Pt is requesting CT results 10/13/16.  DR please advise. Thanks.

## 2016-10-19 MED ORDER — AMOXICILLIN-POT CLAVULANATE 875-125 MG PO TABS: 1.0000 | ORAL_TABLET | Freq: Two times a day (BID) | ORAL | 0 refills | Status: DC

## 2016-10-19 NOTE — Telephone Encounter (Signed)
Pt aware of results and recommendations. Pt aware and voiced her understanding. Rx sent to preferred pharmacy. Nothing further needed.

## 2016-10-19 NOTE — Telephone Encounter (Signed)
No pulmonary embolism, will treat for acute bronchitis with augmentin 875 mg bid for 1 week.

## 2016-10-27 ENCOUNTER — Other Ambulatory Visit: Payer: Self-pay

## 2016-10-27 DIAGNOSIS — R0602 Shortness of breath: Secondary | ICD-10-CM

## 2016-10-31 ENCOUNTER — Ambulatory Visit: Payer: Medicaid Other | Attending: Pulmonary Disease

## 2016-10-31 ENCOUNTER — Ambulatory Visit: Payer: Medicaid Other | Admitting: Pulmonary Disease

## 2016-10-31 DIAGNOSIS — R0602 Shortness of breath: Secondary | ICD-10-CM | POA: Diagnosis present

## 2016-10-31 NOTE — Progress Notes (Unsigned)
Ms [name] Kirby follows commands quite poorly.She is unable to produce any meaningful spirometry data and cannot perform lung volumes.She was able to complete one DLCO measurement

## 2016-11-02 ENCOUNTER — Ambulatory Visit: Payer: Self-pay | Admitting: Pulmonary Disease

## 2016-11-09 ENCOUNTER — Telehealth: Payer: Self-pay | Admitting: Pulmonary Disease

## 2016-11-09 NOTE — Telephone Encounter (Signed)
I think we have already reported to her that the CT chest did not show any blood clot or any other findings of concern. She was not able to perform the PFTs so there are no results to be reported  Theodoro Gristave

## 2016-11-09 NOTE — Telephone Encounter (Signed)
Pt calling stating in the CT scan if we saw anything else  She also would like to know her results on the tests  Please advise.

## 2016-11-09 NOTE — Telephone Encounter (Signed)
Pt informed. Nothing further needed. 

## 2016-11-09 NOTE — Telephone Encounter (Signed)
Pt requesting her CT scan results. [facility]. She had an appt on 11/02/16 but cancelled and your next available appt is 12/28/16. Please advise.

## 2017-03-29 IMAGING — CR DG CHEST 2V
2 series · 2 of 2 positions shown · non-contrast
Comparison: PA and lateral chest 12/27/2012.

CLINICAL DATA: Right side chest pain and shortness of breath since
12/30/2015, worsened today. Initial encounter.

EXAM:
CHEST  2 VIEW

[w chest pa]
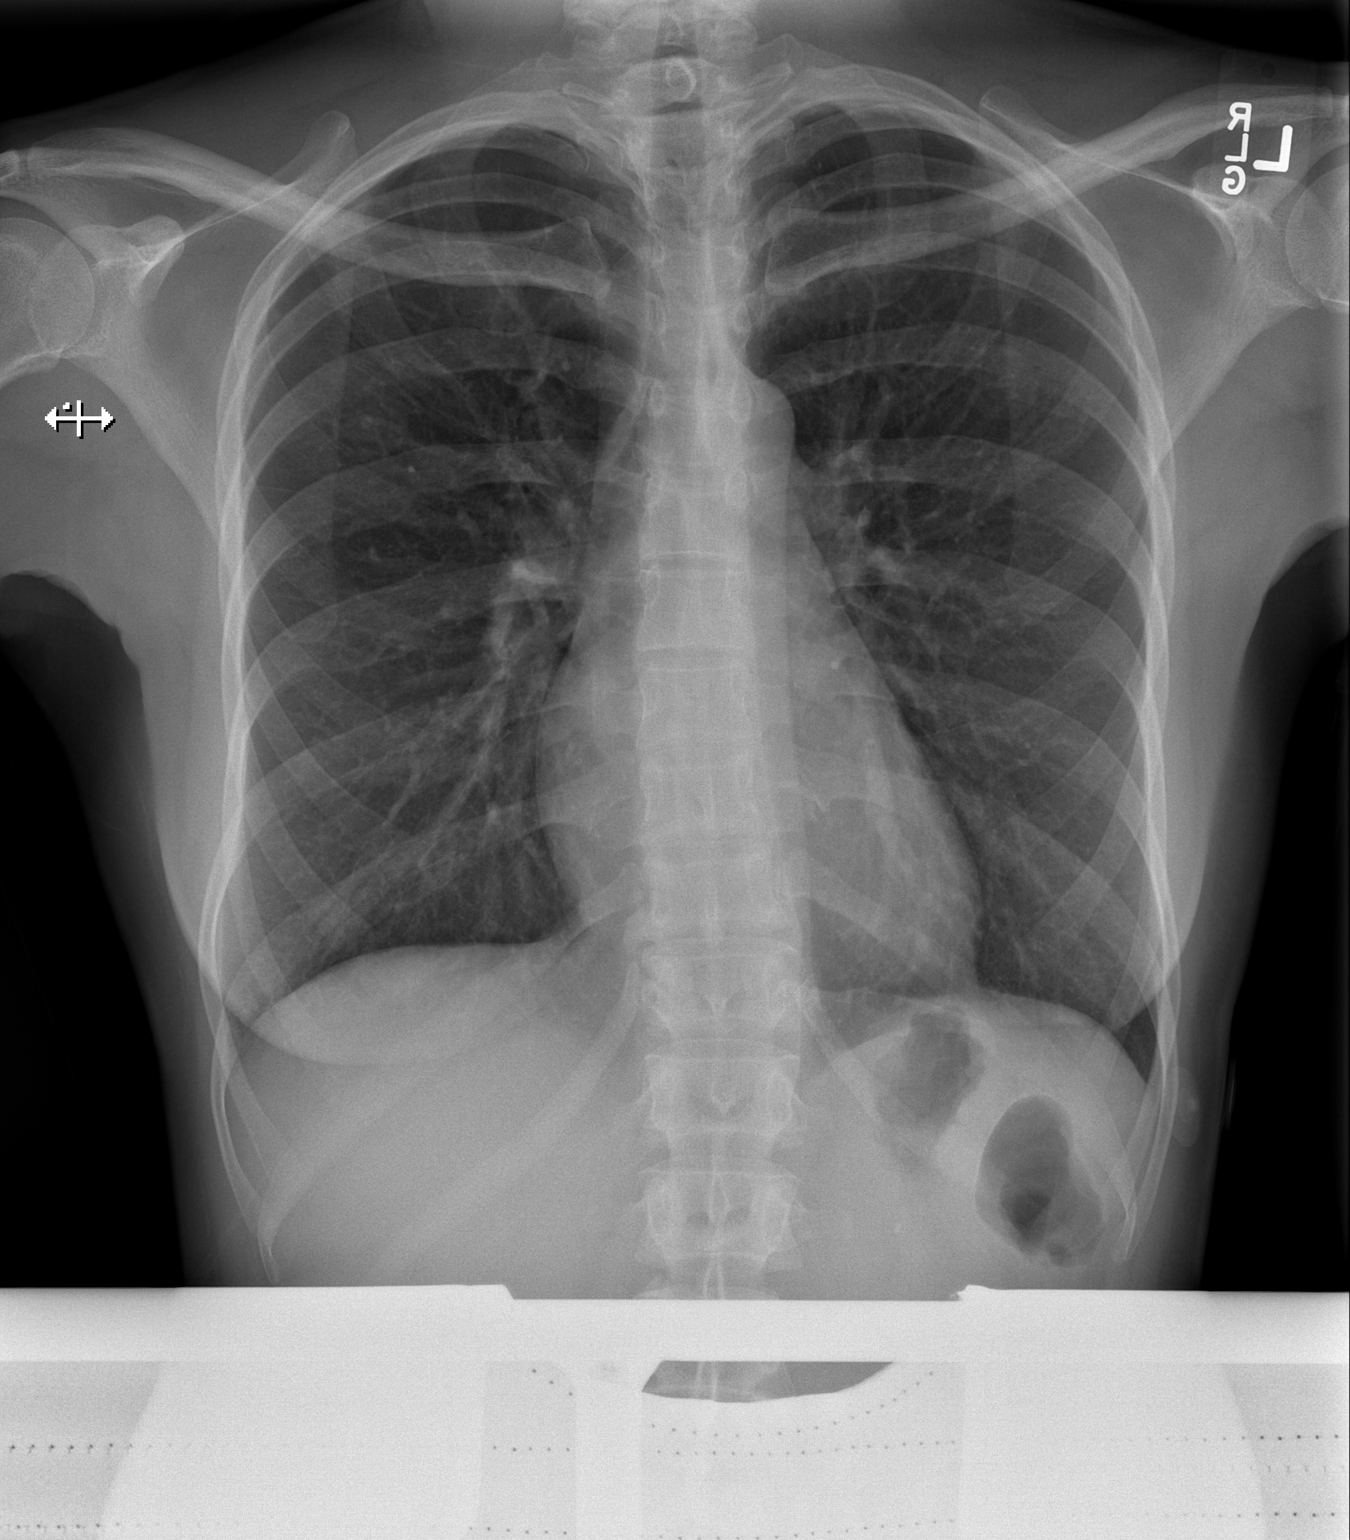

[w chest lat]
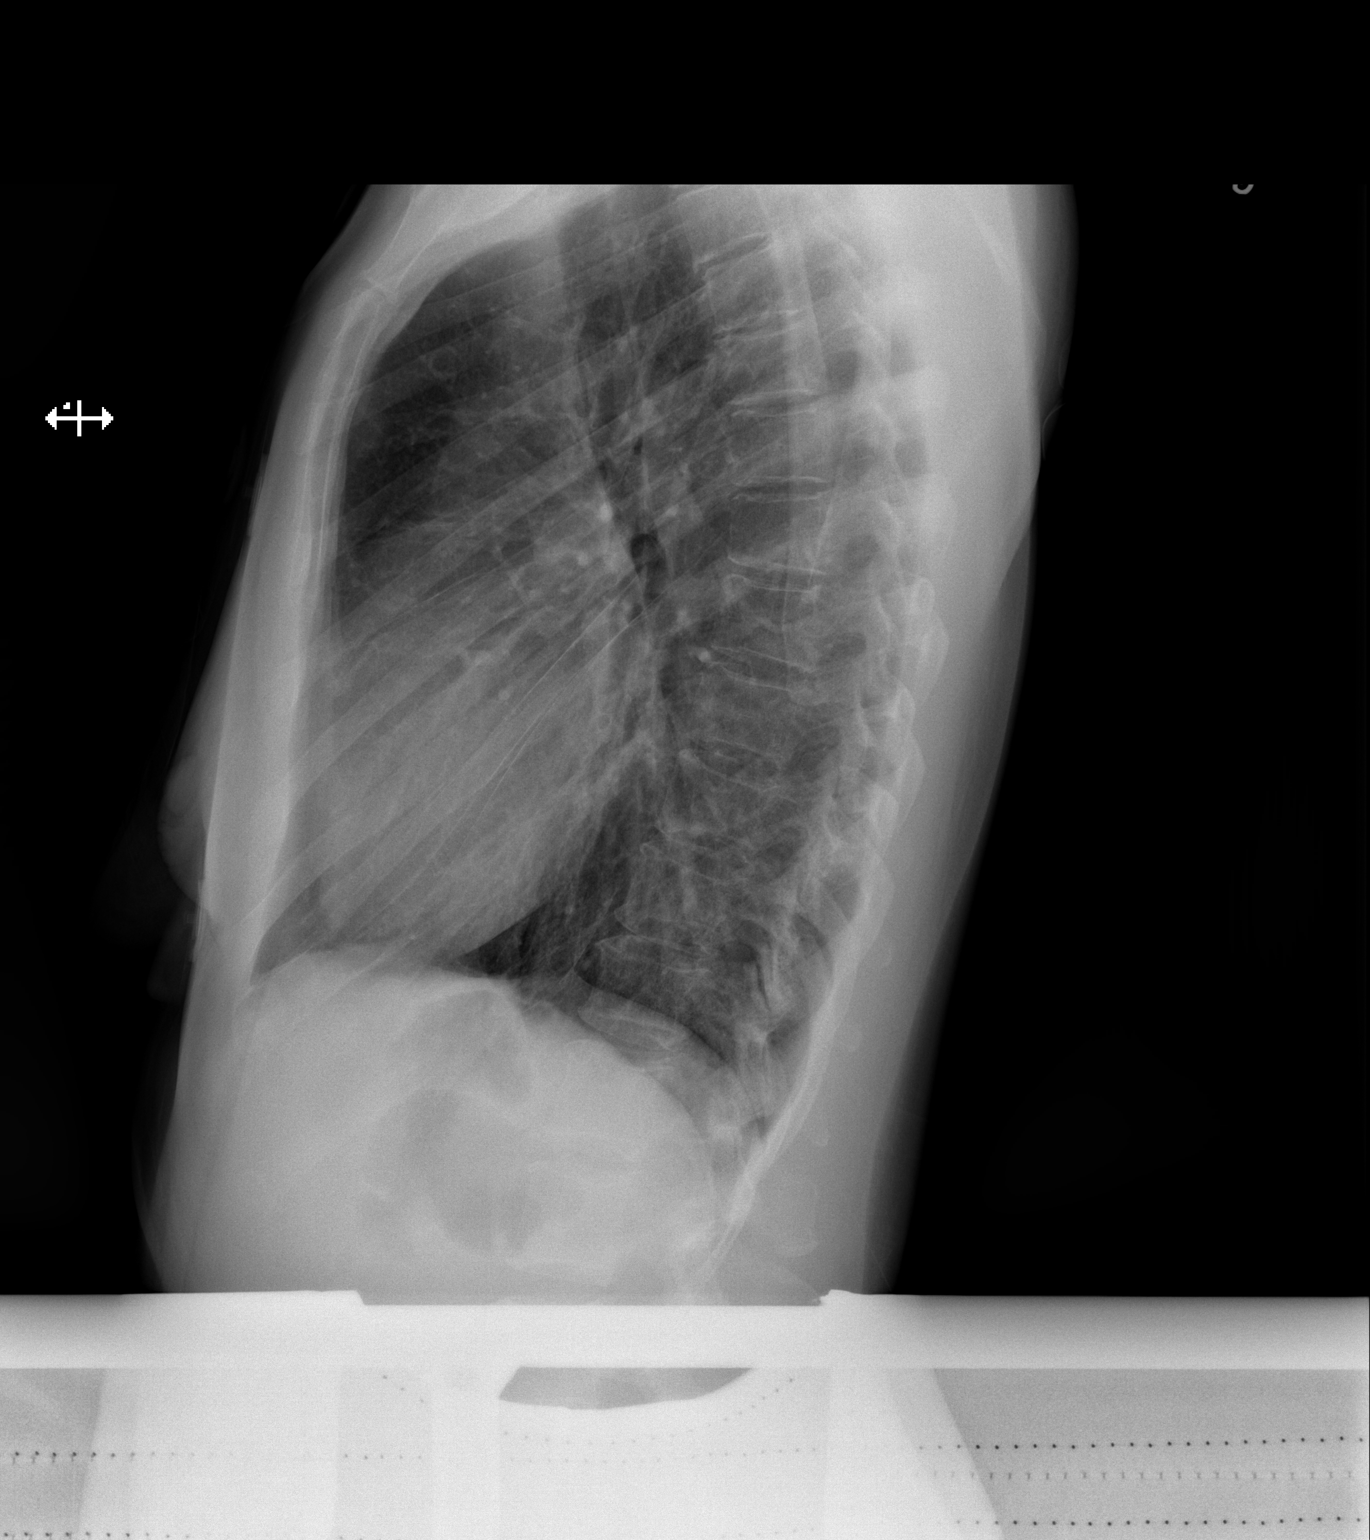

[2 of 2 positions shown; findings below may reference images not displayed]

FINDINGS: The lungs are clear. Heart size is normal. There is no pneumothorax
or pleural effusion.
IMPRESSION: No acute disease.

## 2017-05-18 ENCOUNTER — Emergency Department (HOSPITAL_COMMUNITY)
Admission: EM | Admit: 2017-05-18 | Discharge: 2017-05-18 | Disposition: A | Discharge status: Home / Self Care | Payer: Medicaid Other | Attending: Emergency Medicine | Admitting: Emergency Medicine

## 2017-05-18 ENCOUNTER — Encounter (HOSPITAL_COMMUNITY): Payer: Self-pay | Admitting: *Deleted

## 2017-05-18 ENCOUNTER — Emergency Department (HOSPITAL_COMMUNITY): Payer: Medicaid Other

## 2017-05-18 DIAGNOSIS — F329 Major depressive disorder, single episode, unspecified: Secondary | ICD-10-CM | POA: Diagnosis not present

## 2017-05-18 DIAGNOSIS — Y9389 Activity, other specified: Secondary | ICD-10-CM | POA: Insufficient documentation

## 2017-05-18 DIAGNOSIS — Z79899 Other long term (current) drug therapy: Secondary | ICD-10-CM | POA: Insufficient documentation

## 2017-05-18 DIAGNOSIS — Z87891 Personal history of nicotine dependence: Secondary | ICD-10-CM | POA: Insufficient documentation

## 2017-05-18 DIAGNOSIS — Z7901 Long term (current) use of anticoagulants: Secondary | ICD-10-CM | POA: Insufficient documentation

## 2017-05-18 DIAGNOSIS — M79672 Pain in left foot: Secondary | ICD-10-CM | POA: Diagnosis not present

## 2017-05-18 DIAGNOSIS — S93601A Unspecified sprain of right foot, initial encounter: Secondary | ICD-10-CM | POA: Insufficient documentation

## 2017-05-18 DIAGNOSIS — F419 Anxiety disorder, unspecified: Secondary | ICD-10-CM | POA: Insufficient documentation

## 2017-05-18 DIAGNOSIS — X509XXA Other and unspecified overexertion or strenuous movements or postures, initial encounter: Secondary | ICD-10-CM | POA: Diagnosis not present

## 2017-05-18 DIAGNOSIS — J45909 Unspecified asthma, uncomplicated: Secondary | ICD-10-CM | POA: Insufficient documentation

## 2017-05-18 DIAGNOSIS — Y929 Unspecified place or not applicable: Secondary | ICD-10-CM | POA: Diagnosis not present

## 2017-05-18 DIAGNOSIS — Y998 Other external cause status: Secondary | ICD-10-CM | POA: Diagnosis not present

## 2017-05-18 DIAGNOSIS — M79671 Pain in right foot: Secondary | ICD-10-CM

## 2017-05-18 DIAGNOSIS — S99921A Unspecified injury of right foot, initial encounter: Secondary | ICD-10-CM | POA: Diagnosis present

## 2017-05-18 HISTORY — DX: Unspecified asthma, uncomplicated: J45.909

## 2017-05-18 MED ORDER — IBUPROFEN 800 MG PO TABS
800.0000 mg | ORAL_TABLET | Freq: Once | ORAL | Status: AC
  Administered 2017-05-18: 800 mg via ORAL
  Filled 2017-05-18: qty 1

## 2017-05-18 NOTE — ED Provider Notes (Signed)
[facility] Provider Note   CSN: [id number] Arrival date & time: 05/18/17  1601  By signing my name below, I, Thelma Barge, attest that this documentation has been prepared under the direction and in the presence of Mathews Robinsons, PA-C. Electronically Signed: Thelma Barge, Scribe. 05/18/17. 6:57 PM.  History   Chief Complaint Chief Complaint  Patient presents with  . Foot Pain   The history is provided by the patient. No language interpreter was used.    HPI Comments: [name] Kirby is a 46 y.o. female who presents to the Emergency Department complaining of constant, gradually worsening right-sided foot and ankle pain that began prior to arrival. She has associated swelling to the area. She states she was running quickly and possibly twisted her right foot. She states the pain is worse when standing or walking. She denies other associated symptoms. Pt is a long time Horticulturist, commercial. She has no pertinent medical histories. She reports intermittently taking zoloft and trazodone.   She also complains of left-sided foot pain due to a previous injury to this foot a few weeks ago after dropping a heavy object onto it. Past Medical History:  Diagnosis Date  . Abnormal Pap smear   . Anxiety   . Asthma   . Depression    bipolar  . Gonorrhea   . Headache(784.0)   . Trichomonas     There are no active problems to display for this patient.   Past Surgical History:  Procedure Laterality Date  . CESAREAN SECTION  01/12/2012   Procedure: CESAREAN SECTION;  Surgeon: Adam Phenix, MD;  Location: [facility];  Service: Gynecology;  Laterality: N/A;  . EYE SURGERY Left 1977  . EYE SURGERY    . GYNECOLOGIC CRYOSURGERY      OB History    Gravida Para Term Preterm AB Living   3 3 3  0 0 3   SAB TAB Ectopic Multiple Live Births   0 0 0 0 3       Home Medications    Prior to Admission medications   Medication Sig Start Date End Date Taking? Authorizing Provider    amoxicillin-clavulanate (AUGMENTIN) 875-125 MG tablet Take 1 tablet by mouth 2 (two) times daily. 10/19/16   Shane Crutch, MD  desonide (DESOWEN) 0.05 % lotion Apply 1 application topically 2 (two) times daily. 01/17/16   [provider]  esomeprazole (NEXIUM) 20 MG capsule Take 20 mg by mouth daily at 12 noon.    [provider]  Rivaroxaban 15 & 20 MG TBPK Take as directed on package: Start with one 15mg  tablet by mouth twice a day with food. On Day 22, switch to one 20mg  tablet once a day with food. 02/26/16   Cartner, Sharlet Salina, PA-C  sertraline (ZOLOFT) 50 MG tablet Take 50 mg by mouth daily. Reported on 04/03/2016    [provider]  traZODone (DESYREL) 50 MG tablet Take 50 mg by mouth at bedtime.    [provider]  ursodiol (ACTIGALL) 300 MG capsule Take 300 mg by mouth 2 (two) times daily.    [provider]    Family History Family History  Problem Relation Age of Onset  . Diabetes Mother   . Hypertension Mother   . Diabetes Maternal Grandmother   . Kidney disease Maternal Grandmother   . Anesthesia problems Neg Hx   . Hypotension Neg Hx   . Pseudochol deficiency Neg Hx   . Malignant hyperthermia Neg Hx     Social History  Social History  Substance Use Topics  . Smoking status: Former Smoker    Packs/day: 0.25  . Smokeless tobacco: Never Used     Comment: quit in around 1993  . Alcohol use No     Allergies   Triamcinolone   Review of Systems Review of Systems  Constitutional: Negative for fever.  Musculoskeletal: Positive for arthralgias and joint swelling.     Physical Exam Updated Vital Signs BP 135/81 (BP Location: Left Arm)   Pulse 92   Temp 98.4 F (36.9 C) (Oral)   Resp 18   Ht 5\' 4"  (1.626 m)   Wt 71.2 kg (157 lb)   SpO2 100%   BMI 26.95 kg/m   Physical Exam  Constitutional: She is oriented to person, place, and time. She appears well-developed and well-nourished.  Patient is afebrile,  non-toxic appearing, sitting comfortably in chair in no acute distress.   HENT:  Head: Normocephalic.  Eyes: EOM are normal.  Neck: Normal range of motion.  Cardiovascular: Normal rate, regular rhythm and normal heart sounds.  Exam reveals no gallop and no friction rub.   No murmur heard. Pulmonary/Chest: Effort normal and breath sounds normal. No respiratory distress. She has no wheezes. She has no rales.  Abdominal: She exhibits no distension.  Musculoskeletal: Normal range of motion. She exhibits tenderness. She exhibits no edema.  Negative anterior drawer No pain with eversion or inversion at foot No pain with palpation of medial malleolus Strong dorsalis pedis pulse more tender to resisted plantar flexion No swelling, erythema, or warmth  Neurological: She is alert and oriented to person, place, and time.  Neurovascularly intact  Psychiatric: She has a normal mood and affect.  Nursing note and vitals reviewed.    ED Treatments / Results  DIAGNOSTIC STUDIES: Oxygen Saturation is 100% on RA, normal by my interpretation.    COORDINATION OF CARE: 6:54 PM Discussed treatment plan with pt at bedside and pt agreed to plan.  Labs (all labs ordered are listed, but only abnormal results are displayed) Labs Reviewed - No data to display  EKG  EKG Interpretation None       Radiology Dg Foot Complete Left  Result Date: 05/18/2017 CLINICAL DATA:  Left foot pain, injury EXAM: LEFT FOOT - COMPLETE 3+ VIEW COMPARISON:  None. FINDINGS: No fracture or dislocation is seen. The joint spaces are preserved. The visualized soft tissues are unremarkable. IMPRESSION: Negative. Electronically Signed   By: Charline BillsSriyesh  Krishnan M.D.   On: 05/18/2017 18:38   Dg Foot Complete Right  Result Date: 05/18/2017 CLINICAL DATA:  Right foot pain EXAM: RIGHT FOOT COMPLETE - 3+ VIEW COMPARISON:  None. FINDINGS: No fracture or dislocation is seen. The joint spaces are preserved. Visualized soft tissues are  within normal limits. IMPRESSION: Negative. Electronically Signed   By: Charline BillsSriyesh  Krishnan M.D.   On: 05/18/2017 18:38    Procedures Procedures (including critical care time)  Medications Ordered in ED Medications  ibuprofen (ADVIL,MOTRIN) tablet 800 mg (not administered)     Initial Impression / Assessment and Plan / ED Course  I have reviewed the triage vital signs and the nursing notes.  Pertinent labs & imaging results that were available during my care of the patient were reviewed by me and considered in my medical decision making (see chart for details).     Patient presents with right foot sprain and chronic left foot pain. Reassuring exam. Full rom, no swelling or erythema. Negative anterior drawer. No bony tenderness to palpation.  Plain flims negative. Ankle brace applied and RICE protocol indicated and discussed with patient. Instructed to follow up with PCP if symptoms persist.  Discussed strict return precautions and advised to return to the emergency department if experiencing any new or worsening symptoms. Instructions were understood and patient agreed with discharge plan.  Final Clinical Impressions(s) / ED Diagnoses   Final diagnoses:  Foot pain, right  Sprain of right foot, initial encounter  Foot pain, left    New Prescriptions New Prescriptions   No medications on file  I personally performed the services described in this documentation, which was scribed in my presence. The recorded information has been reviewed and is accurate.     Gregary Cromer 05/18/17 1914    Donnetta Hutching, MD 05/19/17 [phone]

## 2017-05-18 NOTE — Discharge Instructions (Signed)
As discussed, follow the RICE protocol provided in these instructions. Ibuprofen for pain and swelling.  Follow up with your primary care provider if symptoms persist beyond 1-2 weeks. Return if symptoms worsen or you experience new concerning symptoms in the meantime.

## 2017-05-18 NOTE — ED Notes (Signed)
See EDP secondary assessment.  

## 2017-05-18 NOTE — ED Triage Notes (Signed)
Pt reports injuring left foot several weeks ago when object fell on it, still has some pain. No swelling noted. Recent injury to right foot, now has pain and swelling to that foot. Unable to sleep due to pain. Ambulatory at triage.

## 2017-05-18 NOTE — ED Notes (Signed)
Patient transported to X-ray 

## 2017-07-01 ENCOUNTER — Emergency Department (HOSPITAL_COMMUNITY)
Admission: EM | Admit: 2017-07-01 | Discharge: 2017-07-01 | Disposition: A | Discharge status: Home / Self Care | Payer: Medicaid Other | Attending: Emergency Medicine | Admitting: Emergency Medicine

## 2017-07-01 ENCOUNTER — Emergency Department (HOSPITAL_COMMUNITY): Payer: Medicaid Other

## 2017-07-01 DIAGNOSIS — Z87891 Personal history of nicotine dependence: Secondary | ICD-10-CM | POA: Insufficient documentation

## 2017-07-01 DIAGNOSIS — J45909 Unspecified asthma, uncomplicated: Secondary | ICD-10-CM | POA: Insufficient documentation

## 2017-07-01 DIAGNOSIS — R079 Chest pain, unspecified: Secondary | ICD-10-CM | POA: Insufficient documentation

## 2017-07-01 LAB — COMPREHENSIVE METABOLIC PANEL
ALBUMIN: 3.2 g/dL — AB (ref 3.5–5.0)
ALK PHOS: 59 U/L (ref 38–126)
ALT: <5 U/L — ABNORMAL LOW (ref 14–54)
AST: 16 U/L (ref 15–41)
Anion gap: 5 (ref 5–15)
BILIRUBIN TOTAL: 0.3 mg/dL (ref 0.3–1.2)
BUN: 7 mg/dL (ref 6–20)
CALCIUM: 9 mg/dL (ref 8.9–10.3)
CO2: 26 mmol/L (ref 22–32)
Chloride: 107 mmol/L (ref 101–111)
Creatinine, Ser: 0.7 mg/dL (ref 0.44–1.00)
GFR calc Af Amer: >60 mL/min (ref >60)
GFR calc non Af Amer: >60 mL/min (ref >60)
GLUCOSE: 132 mg/dL — AB (ref 65–99)
Potassium: 3.8 mmol/L (ref 3.5–5.1)
Sodium: 138 mmol/L (ref 135–145)
TOTAL PROTEIN: 6.3 g/dL — AB (ref 6.5–8.1)

## 2017-07-01 LAB — CBC
HEMATOCRIT: 38 % (ref 36.0–46.0)
Hemoglobin: 12.7 g/dL (ref 12.0–15.0)
MCH: 31.3 pg (ref 26.0–34.0)
MCHC: 33.4 g/dL (ref 30.0–36.0)
MCV: 93.6 fL (ref 78.0–100.0)
Platelets: 315 10*3/uL (ref 150–400)
RBC: 4.06 MIL/uL (ref 3.87–5.11)
RDW: 13.7 % (ref 11.5–15.5)
WBC: 6.3 10*3/uL (ref 4.0–10.5)

## 2017-07-01 LAB — I-STAT TROPONIN, ED: Troponin i, poc: 0 ng/mL (ref 0.00–0.08)

## 2017-07-01 LAB — D-DIMER, QUANTITATIVE: D-Dimer, Quant: 0.45 ug/mL-FEU (ref 0.00–0.50)

## 2017-07-01 MED ORDER — IBUPROFEN 600 MG PO TABS: 600.0000 mg | ORAL_TABLET | Freq: Four times a day (QID) | ORAL | 0 refills | Status: DC | PRN

## 2017-07-01 NOTE — ED Provider Notes (Signed)
[facility] Provider Note   CSN: [id number] Arrival date & time: 07/01/17  0036     History   Chief Complaint Chief Complaint  Patient presents with  . Chest Pain    HPI [name] Kirby is a 46 y.o. female presenting with sudden onset right sided chest pain nonradiating that started while she was trying to put her baby to sleep tonight. Or its associated shortness of breath and nausea vomiting. She reports a history of similar pain without shortness of breath over the last 3 weeks. She reports a history of anxiety and asthma. She attributes her symptoms to either anxiety or may be a gallstone that she was told she had in the past.  She expires that her symptoms have now subsided and she is much more comfortable. No nausea or shortness of breath at this time. She states that the pain is exacerbated by movement and relieved by rest. She reports a history of a small PE in the right lower lobe over a year ago and was on Zaroxolyn which was discontinued in October 2017. She is currently on Depo-Provera for birth control, denies recent surgery, prolonged immobilization, hemoptysis.  HPI  Past Medical History:  Diagnosis Date  . Abnormal Pap smear   . Anxiety   . Asthma   . Depression    bipolar  . Gonorrhea   . Headache(784.0)   . Trichomonas     There are no active problems to display for this patient.   Past Surgical History:  Procedure Laterality Date  . CESAREAN SECTION  01/12/2012   Procedure: CESAREAN SECTION;  Surgeon: Adam Phenix, MD;  Location: [facility];  Service: Gynecology;  Laterality: N/A;  . EYE SURGERY Left 1977  . EYE SURGERY    . GYNECOLOGIC CRYOSURGERY      OB History    Gravida Para Term Preterm AB Living   3 3 3  0 0 3   SAB TAB Ectopic Multiple Live Births   0 0 0 0 3       Home Medications    Prior to Admission medications   Medication Sig Start Date End Date Taking? Authorizing Provider  albuterol (PROVENTIL HFA;VENTOLIN HFA)  108 (90 Base) MCG/ACT inhaler Inhale 1-2 puffs into the lungs every 6 (six) hours as needed for wheezing or shortness of breath.   Yes [provider]  sertraline (ZOLOFT) 50 MG tablet Take 50 mg by mouth daily. Reported on 04/03/2016   Yes [provider]  traZODone (DESYREL) 50 MG tablet Take 50 mg by mouth at bedtime as needed for sleep.    Yes [provider]  ibuprofen (ADVIL,MOTRIN) 600 MG tablet Take 1 tablet (600 mg total) by mouth every 6 (six) hours as needed. 07/01/17   Georgiana Shore, PA-C    Family History Family History  Problem Relation Age of Onset  . Diabetes Mother   . Hypertension Mother   . Diabetes Maternal Grandmother   . Kidney disease Maternal Grandmother   . Anesthesia problems Neg Hx   . Hypotension Neg Hx   . Pseudochol deficiency Neg Hx   . Malignant hyperthermia Neg Hx     Social History Social History  Substance Use Topics  . Smoking status: Former Smoker    Packs/day: 0.25  . Smokeless tobacco: Never Used     Comment: quit in around 1993  . Alcohol use No     Allergies   Triamcinolone   Review of Systems Review of Systems  Constitutional: Negative for chills, diaphoresis and fever.  HENT: Negative for ear pain and sore throat.   Eyes: Negative for pain and visual disturbance.  Respiratory: Positive for shortness of breath. Negative for cough.   Cardiovascular: Positive for chest pain. Negative for palpitations and leg swelling.       She reports bilateral ankle swelling when she is on her feet for prolonged periods of time but no unilateral leg swelling or calf pain.  Gastrointestinal: Negative for abdominal distention, abdominal pain, nausea and vomiting.  Genitourinary: Negative for dysuria and hematuria.  Musculoskeletal: Negative for arthralgias, back pain, neck pain and neck stiffness.  Skin: Negative for color change, pallor and rash.  Neurological: Negative for dizziness, seizures, syncope, facial  asymmetry, weakness and light-headedness.     Physical Exam Updated Vital Signs BP 123/76   Pulse 66   Resp 14   Ht 5\' 4"  (1.626 m)   Wt 71.2 kg (157 lb)   SpO2 100%   BMI 26.95 kg/m   Physical Exam  Constitutional: She appears well-developed and well-nourished. No distress.  Nontoxic-appearing, lying comfortably in bed in no acute distress.  HENT:  Head: Normocephalic and atraumatic.  Eyes: Conjunctivae and EOM are normal.  Neck: Normal range of motion.  Cardiovascular: Normal rate, regular rhythm, normal heart sounds and intact distal pulses.   No murmur heard. Pulmonary/Chest: Effort normal and breath sounds normal. No respiratory distress. She has no wheezes. She has no rales. She exhibits no tenderness.  Abdominal: Soft. She exhibits no distension. There is no tenderness.  Musculoskeletal: Normal range of motion. She exhibits no edema, tenderness or deformity.  No lower extremity swelling or tenderness palpation.  Neurological: She is alert.  Skin: Skin is warm and dry. No rash noted. She is not diaphoretic. No erythema. No pallor.  Psychiatric: She has a normal mood and affect.  Nursing note and vitals reviewed.    ED Treatments / Results  Labs (all labs ordered are listed, but only abnormal results are displayed) Labs Reviewed  COMPREHENSIVE METABOLIC PANEL - Abnormal; Notable for the following:       Result Value   Glucose, Bld 132 (*)    Total Protein 6.3 (*)    Albumin 3.2 (*)    ALT <5 (*)    All other components within normal limits  CBC  D-DIMER, QUANTITATIVE (NOT AT [facility])  I-STAT TROPONIN, ED    EKG  EKG Interpretation  Date/Time:  Sunday July 01 2017 00:45:21 EDT Ventricular Rate:  73 PR Interval:    QRS Duration: 90 QT Interval:  368 QTC Calculation: 406 R Axis:   50 Text Interpretation:  Sinus rhythm No significant change since last tracing Confirmed by Drema Pryardama, Pedro [phone]([id number]) on 07/01/2017 1:28:18 AM       Radiology Dg Chest 2  View  Result Date: 07/01/2017 CLINICAL DATA:  Chest pain and dyspnea for 1 day EXAM: CHEST  2 VIEW COMPARISON:  10/13/2016, 09/29/2016. FINDINGS: The lungs are clear. The pulmonary vasculature is normal. Heart size is normal. Hilar and mediastinal contours are unremarkable. There is no pleural effusion. IMPRESSION: No active cardiopulmonary disease. Electronically Signed   By: Ellery Plunkaniel R Verlin Uher M.D.   On: 07/01/2017 02:03    Procedures Procedures (including critical care time)  Medications Ordered in ED Medications - No data to display   Initial Impression / Assessment and Plan / ED Course  I have reviewed the triage vital signs and the nursing notes.  Pertinent labs & imaging results that  were available during my care of the patient were reviewed by me and considered in my medical decision making (see chart for details).    Patient presenting with sudden onset of right-sided chest pain with associated shortness of breath and nausea vomiting. Patient reported having had this similar chest discomfort for over 3 weeks  Symptoms had subsided by the time she was in the emergency department. She reports significant improvement.  Patient was satting 100% on room air with normal heart rate and stable throughout her stay in the emergency department. I have a very low suspicion for PE in this patient. D-dimer negative.  Workup unremarkable, negative troponin. EKG unchanged. Chest x-ray negative.  She was well-appearing and nontoxic. Heart score: 0 Low suspicion for ACS in this patient.  Discharge home with close follow-up with PCP. Her pain may be more consistent with musculoskeletal chest wall pain.  Discussed strict return precautions and advised to return to the emergency department if experiencing any new or worsening symptoms. Instructions were understood and patient agreed with discharge plan. Final Clinical Impressions(s) / ED Diagnoses   Final diagnoses:  Nonspecific chest pain     New Prescriptions Discharge Medication List as of 07/01/2017  4:09 AM    START taking these medications   Details  ibuprofen (ADVIL,MOTRIN) 600 MG tablet Take 1 tablet (600 mg total) by mouth every 6 (six) hours as needed., Starting Sun 07/01/2017, Print         Georgiana Shore, PA-C 07/01/17 0526    Nira Conn, MD 07/01/17 [phone]

## 2017-07-01 NOTE — ED Triage Notes (Signed)
Per EMS pt was at home and complaining of centralized chest pain and said it radiated to the back. Having SOB. Was given 324 of aspirin and 3 nitro pills. 147/76, p 71, 18, rr, 100%

## 2017-07-01 NOTE — Discharge Instructions (Signed)
As discussed, your blood work and imaging were very reassuring today.  Make sure to follow-up with your primary care provider. Take ibuprofen for pain.  Return if chest pain worsens, shortness of breath, nausea, vomiting, or any other new concerning symptoms in the meantime

## 2017-07-01 NOTE — ED Notes (Signed)
Patient transported to X-ray 

## 2017-08-27 ENCOUNTER — Emergency Department (HOSPITAL_COMMUNITY): Payer: Medicaid Other

## 2017-08-27 ENCOUNTER — Encounter (HOSPITAL_COMMUNITY): Payer: Self-pay

## 2017-08-27 ENCOUNTER — Emergency Department (HOSPITAL_COMMUNITY)
Admission: EM | Admit: 2017-08-27 | Discharge: 2017-08-27 | Disposition: A | Discharge status: Home / Self Care | Payer: Medicaid Other | Attending: Emergency Medicine | Admitting: Emergency Medicine

## 2017-08-27 DIAGNOSIS — K8020 Calculus of gallbladder without cholecystitis without obstruction: Secondary | ICD-10-CM | POA: Insufficient documentation

## 2017-08-27 DIAGNOSIS — Z79899 Other long term (current) drug therapy: Secondary | ICD-10-CM | POA: Insufficient documentation

## 2017-08-27 DIAGNOSIS — M549 Dorsalgia, unspecified: Secondary | ICD-10-CM

## 2017-08-27 DIAGNOSIS — Z87891 Personal history of nicotine dependence: Secondary | ICD-10-CM | POA: Diagnosis not present

## 2017-08-27 DIAGNOSIS — J45909 Unspecified asthma, uncomplicated: Secondary | ICD-10-CM | POA: Insufficient documentation

## 2017-08-27 DIAGNOSIS — R1010 Upper abdominal pain, unspecified: Secondary | ICD-10-CM | POA: Diagnosis present

## 2017-08-27 HISTORY — DX: Calculus of gallbladder without cholecystitis without obstruction: K80.20

## 2017-08-27 LAB — CBC
HCT: 40.6 % (ref 36.0–46.0)
HEMOGLOBIN: 13.8 g/dL (ref 12.0–15.0)
MCH: 32.5 pg (ref 26.0–34.0)
MCHC: 34 g/dL (ref 30.0–36.0)
MCV: 95.5 fL (ref 78.0–100.0)
PLATELETS: 321 10*3/uL (ref 150–400)
RBC: 4.25 MIL/uL (ref 3.87–5.11)
RDW: 14 % (ref 11.5–15.5)
WBC: 5.1 10*3/uL (ref 4.0–10.5)

## 2017-08-27 LAB — COMPREHENSIVE METABOLIC PANEL
ALT: <5 U/L — ABNORMAL LOW (ref 14–54)
ANION GAP: 8 (ref 5–15)
AST: 17 U/L (ref 15–41)
Albumin: 3.6 g/dL (ref 3.5–5.0)
Alkaline Phosphatase: 61 U/L (ref 38–126)
BUN: 6 mg/dL (ref 6–20)
CHLORIDE: 107 mmol/L (ref 101–111)
CO2: 22 mmol/L (ref 22–32)
CREATININE: 0.65 mg/dL (ref 0.44–1.00)
Calcium: 9.1 mg/dL (ref 8.9–10.3)
GFR calc Af Amer: >60 mL/min (ref >60)
Glucose, Bld: 103 mg/dL — ABNORMAL HIGH (ref 65–99)
POTASSIUM: 3.8 mmol/L (ref 3.5–5.1)
Sodium: 137 mmol/L (ref 135–145)
Total Bilirubin: 1.1 mg/dL (ref 0.3–1.2)
Total Protein: 7.1 g/dL (ref 6.5–8.1)

## 2017-08-27 LAB — URINALYSIS, ROUTINE W REFLEX MICROSCOPIC
BILIRUBIN URINE: NEGATIVE
GLUCOSE, UA: NEGATIVE mg/dL
Hgb urine dipstick: NEGATIVE
KETONES UR: NEGATIVE mg/dL
LEUKOCYTES UA: NEGATIVE
Nitrite: NEGATIVE
PH: 8 (ref 5.0–8.0)
PROTEIN: 30 mg/dL — AB
RBC / HPF: NONE SEEN RBC/hpf (ref 0–5)
Specific Gravity, Urine: 1.021 (ref 1.005–1.030)

## 2017-08-27 LAB — LIPASE, BLOOD: Lipase: 19 U/L (ref 11–51)

## 2017-08-27 LAB — PREGNANCY, URINE: Preg Test, Ur: NEGATIVE

## 2017-08-27 MED ORDER — MORPHINE SULFATE (PF) 4 MG/ML IV SOLN
4.0000 mg | Freq: Once | INTRAVENOUS | Status: AC
  Administered 2017-08-27: 4 mg via INTRAVENOUS
  Filled 2017-08-27: qty 1

## 2017-08-27 MED ORDER — SODIUM CHLORIDE 0.9 % IV BOLUS (SEPSIS)
500.0000 mL | Freq: Once | INTRAVENOUS | Status: AC
  Administered 2017-08-27: 500 mL via INTRAVENOUS

## 2017-08-27 MED ORDER — ONDANSETRON 4 MG PO TBDP: 4.0000 mg | ORAL_TABLET | Freq: Three times a day (TID) | ORAL | 0 refills | Status: AC | PRN

## 2017-08-27 MED ORDER — NAPROXEN 500 MG PO TABS: 500.0000 mg | ORAL_TABLET | Freq: Two times a day (BID) | ORAL | 0 refills | Status: DC | PRN

## 2017-08-27 MED ORDER — KETOROLAC TROMETHAMINE 30 MG/ML IJ SOLN
30.0000 mg | Freq: Once | INTRAMUSCULAR | Status: AC
  Administered 2017-08-27: 30 mg via INTRAVENOUS
  Filled 2017-08-27: qty 1

## 2017-08-27 NOTE — ED Notes (Signed)
Patient transported to Ultrasound 

## 2017-08-27 NOTE — ED Provider Notes (Signed)
[facility] EMERGENCY DEPARTMENT Provider Note   CSN: [id number] Arrival date & time: 08/27/17  5284     History   Chief Complaint Chief Complaint  Patient presents with  . Back Pain  . Abdominal Pain    HPI Cidney Kirkwood Laural Benes is a 46 y.o. female.  The history is provided by the patient and medical records. No language interpreter was used.  Back Pain   Associated symptoms include abdominal pain. Pertinent negatives include no fever.  Abdominal Pain   Associated symptoms include nausea and vomiting (Resolved). Pertinent negatives include fever, diarrhea and constipation.    Kyan Renee Matea Stanard is a 46 y.o. female  with a PMH of gallstones, anxiety, depression who presents to the Emergency Department complaining of right sided back pain which radiates around to right upper abdomen x 5 days. Pain is intermittent and is described as feeling "like contractions". Pain worsened today. Associated with nausea. Initially she experienced 2-3 episodes of emesis, but this has not occurred in the last three days. Tried OTC pain medication with little improvement. Pain is worse with certain movements, better when lying still. Denies history of similar.  Fever, chills, urinary symptoms, vaginal discharge, chest pain, shortness of breath.    Past Medical History:  Diagnosis Date  . Abnormal Pap smear   . Anxiety   . Asthma   . Depression    bipolar  . Gallstones   . Gonorrhea   . Headache(784.0)   . Trichomonas     There are no active problems to display for this patient.   Past Surgical History:  Procedure Laterality Date  . CESAREAN SECTION  01/12/2012   Procedure: CESAREAN SECTION;  Surgeon: Adam Phenix, MD;  Location: [facility];  Service: Gynecology;  Laterality: N/A;  . EYE SURGERY Left 1977  . EYE SURGERY    . GYNECOLOGIC CRYOSURGERY      OB History    Gravida Para Term Preterm AB Living   3 3 3  0 0 3   SAB TAB Ectopic Multiple Live  Births   0 0 0 0 3       Home Medications    Prior to Admission medications   Medication Sig Start Date End Date Taking? Authorizing Provider  albuterol (PROVENTIL HFA;VENTOLIN HFA) 108 (90 Base) MCG/ACT inhaler Inhale 1-2 puffs into the lungs every 6 (six) hours as needed for wheezing or shortness of breath.   Yes [provider]  ibuprofen (ADVIL,MOTRIN) 600 MG tablet Take 1 tablet (600 mg total) by mouth every 6 (six) hours as needed. Patient taking differently: Take 600 mg by mouth every 6 (six) hours as needed for moderate pain.  07/01/17  Yes Mathews Robinsons B, PA-C  sertraline (ZOLOFT) 50 MG tablet Take 50 mg by mouth daily. Reported on 04/03/2016   Yes [provider]  traZODone (DESYREL) 50 MG tablet Take 50 mg by mouth at bedtime as needed for sleep.    Yes [provider]  naproxen (NAPROSYN) 500 MG tablet Take 1 tablet (500 mg total) by mouth 2 (two) times daily as needed for moderate pain. 08/27/17   Jenisha Faison, Chase Picket, PA-C  ondansetron (ZOFRAN ODT) 4 MG disintegrating tablet Take 1 tablet (4 mg total) by mouth every 8 (eight) hours as needed for nausea or vomiting. 08/27/17   Malick Netz, Chase Picket, PA-C    Family History Family History  Problem Relation Age of Onset  . Diabetes Mother   . Hypertension Mother   .  Diabetes Maternal Grandmother   . Kidney disease Maternal Grandmother   . Anesthesia problems Neg Hx   . Hypotension Neg Hx   . Pseudochol deficiency Neg Hx   . Malignant hyperthermia Neg Hx     Social History Social History  Substance Use Topics  . Smoking status: Former Smoker    Packs/day: 0.25  . Smokeless tobacco: Never Used     Comment: quit in around 1993  . Alcohol use No     Allergies   Other and Triamcinolone   Review of Systems Review of Systems  Constitutional: Negative for chills and fever.  Gastrointestinal: Positive for abdominal pain, nausea and vomiting (Resolved). Negative for blood in stool,  constipation and diarrhea.  Musculoskeletal: Positive for back pain.     Physical Exam Updated Vital Signs BP 108/78   Pulse 83   Temp 98.5 F (36.9 C) (Oral)   Resp 16   Ht 5\' 3"  (1.6 m)   Wt 70.8 kg (156 lb)   SpO2 100%   BMI 27.63 kg/m   Physical Exam  Constitutional: She is oriented to person, place, and time. She appears well-developed and well-nourished. No distress.  HENT:  Head: Normocephalic and atraumatic.  Cardiovascular: Normal rate, regular rhythm and normal heart sounds.   No murmur heard. Pulmonary/Chest: Effort normal and breath sounds normal. No respiratory distress.  Abdominal: Soft. She exhibits no distension.  Tenderness to palpation of right upper quadrant without rebound or guarding.  Negative Murphy's.  Patient also has tenderness of the right flank and of the right lower back.  No overlying skin changes noted.  Musculoskeletal: She exhibits no edema.  Neurological: She is alert and oriented to person, place, and time.  Skin: Skin is warm and dry.  Nursing note and vitals reviewed.    ED Treatments / Results  Labs (all labs ordered are listed, but only abnormal results are displayed) Labs Reviewed  COMPREHENSIVE METABOLIC PANEL - Abnormal; Notable for the following:       Result Value   Glucose, Bld 103 (*)    ALT <5 (*)    All other components within normal limits  URINALYSIS, ROUTINE W REFLEX MICROSCOPIC - Abnormal; Notable for the following:    APPearance HAZY (*)    Protein, ur 30 (*)    Bacteria, UA FEW (*)    Squamous Epithelial / LPF 0-5 (*)    All other components within normal limits  LIPASE, BLOOD  CBC  PREGNANCY, URINE    EKG  EKG Interpretation None       Radiology US Abdomen Complete  Result Date: 08/27/2017 CLINICAL DATA:  Right upper quadrant pain, right back/flank pain x4 days EXAM: ABDOMEN ULTRASOUND COMPLETE COMPARISON:  CT abdomen/ pelvis dated 02/17/2016 FINDINGS: Gallbladder: Multiple gallstones, measuring up  to 10 mm. No gallbladder wall thickening or pericholecystic fluid. Negative sonographic Murphy's sign. Common bile duct: Diameter: 3 mm Liver: No focal lesion identified. Within normal limits in parenchymal echogenicity. Portal vein is patent on color Doppler imaging with normal direction of blood flow towards the liver. IVC: No abnormality visualized. Pancreas: Not discretely visualized due to overlying bowel gas. Spleen: Size and appearance within normal limits. Right Kidney: Length: 10.5 cm. 1.5 x 1.5 x 1.4 cm simple interpolar cyst. No hydronephrosis. Left Kidney: Length: 10.5 cm.  No mass or hydronephrosis. Abdominal aorta: No aneurysm visualized. Other findings: None. IMPRESSION: Cholelithiasis, without associated sonographic findings to suggest acute cholecystitis. 1.5 cm simple right renal cyst. Electronically Signed  By: Charline BillsSriyesh  Krishnan M.D.   On: 08/27/2017 15:20    Procedures Procedures (including critical care time)  Medications Ordered in ED Medications  sodium chloride 0.9 % bolus 500 mL (0 mLs Intravenous Stopped 08/27/17 1549)  morphine 4 MG/ML injection 4 mg (4 mg Intravenous Given 08/27/17 1214)  ketorolac (TORADOL) 30 MG/ML injection 30 mg (30 mg Intravenous Given 08/27/17 1548)     Initial Impression / Assessment and Plan / ED Course  I have reviewed the triage vital signs and the nursing notes.  Pertinent labs & imaging results that were available during my care of the patient were reviewed by me and considered in my medical decision making (see chart for details).    Cathyann Renee Jonathon ResidesDark Johnson is a 46 y.o. female who presents to ED for RUQ abdominal pain and right-sided back pain. Worse with movement and palpation, improved when still. Afebrile, hemodynamically stable. Labs reviewed and reassuring. White count normal. UA without signs of infection. Ddx include nephrolithiasis, gallbladder or musk etiology.   Abdominal ultrasound: Cholelithiasis without findings to  suggest acute cholecystitis.  No evidence of kidney stones.   Repeat abdominal exam with no peritoneal signs.  Patient tolerating PO without emesis in the ED.  Plan: symptomatic home care and follow up with gen surg as outpatient.   Discussed plan of care and return precautions with patient.  All questions answered.   Final Clinical Impressions(s) / ED Diagnoses   Final diagnoses:  Calculus of gallbladder without cholecystitis without obstruction  Acute right-sided back pain, unspecified back location    New Prescriptions New Prescriptions   NAPROXEN (NAPROSYN) 500 MG TABLET    Take 1 tablet (500 mg total) by mouth 2 (two) times daily as needed for moderate pain.   ONDANSETRON (ZOFRAN ODT) 4 MG DISINTEGRATING TABLET    Take 1 tablet (4 mg total) by mouth every 8 (eight) hours as needed for nausea or vomiting.     Maikol Grassia, Chase PicketJaime Pilcher, PA-C 08/27/17 1601    Mesner, Barbara CowerJason, MD 08/28/17 [phone]

## 2017-08-27 NOTE — ED Triage Notes (Signed)
Per Pt, Pt is coming from home with complaints of bilateral back pain that radiates to her abdomen. Pt has hx of gallstones. Reports some vaginal odor, but no discharge or bleeding. Denies urinary symptoms. Pt reports not having scheduled Depo shot on 10/17 due to hx of blood clot.

## 2017-08-27 NOTE — Discharge Instructions (Signed)
It was my pleasure taking care of you today!  Naproxen as needed for pain. Zofran as needed for nausea.   Please call the surgery clinic listed for further discussion of your gallstones.   At this time, there does not appear to be an acute, emergent cause for your abdominal pain. However, this does not mean that your abdominal pain may not become an emergency in the future. It is VERY important that you monitor your symptoms and return to the Emergency Department if you develop any of the following symptoms:  You have a fever.  You keep throwing up and can't keep fluids down.  You pass bloody or black tarry stools.  There is bright red blood in the stool. You do not seem to be getting better.  You have any questions or concerns.

## 2017-11-13 IMAGING — US US ABDOMEN COMPLETE
1 series · 14 of 25 positions shown · non-contrast
Comparison: CT abdomen/ pelvis dated 02/17/2016

CLINICAL DATA: Right upper quadrant pain, right back/flank pain x4
days

EXAM:
ABDOMEN ULTRASOUND COMPLETE

[Series 1: us abdomen complete · 0.14mm/px · 14 of 99 slices shown]
[im 1/99]
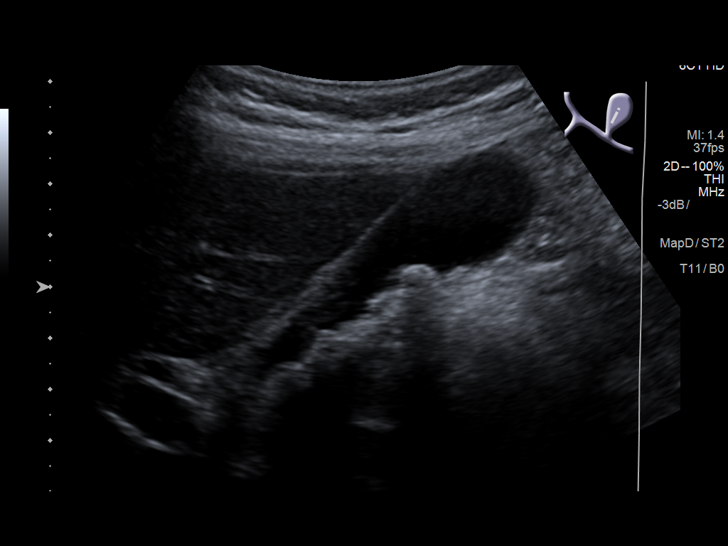
[im 9/99]
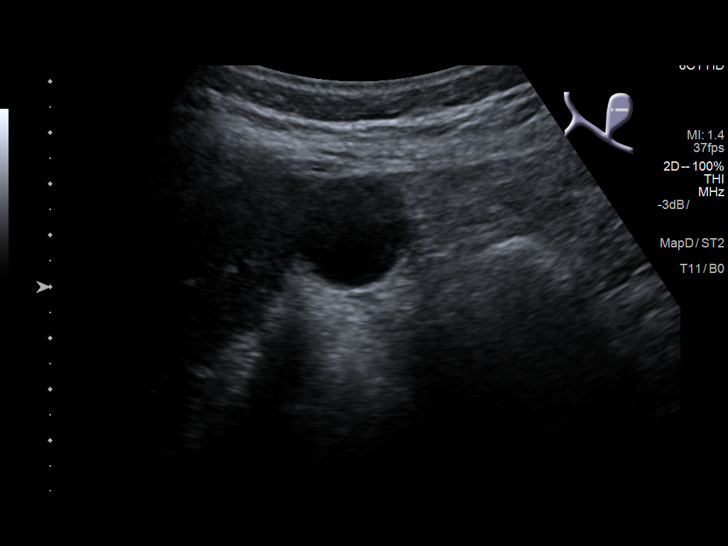
[im 17/99]
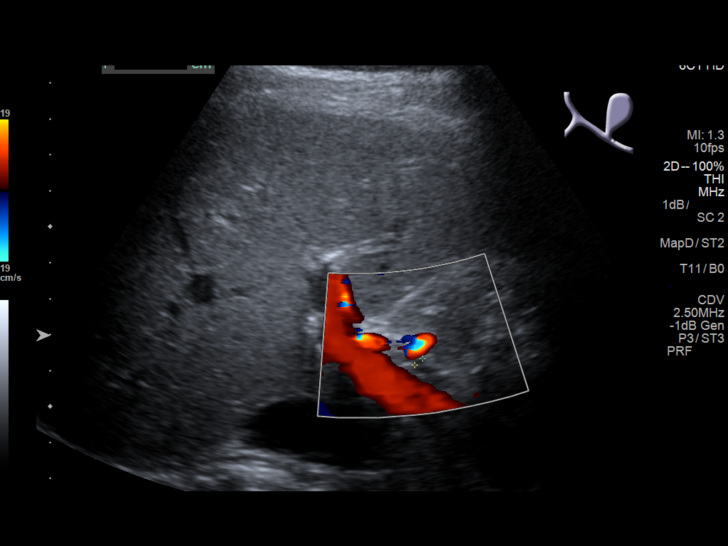
[im 25/99]
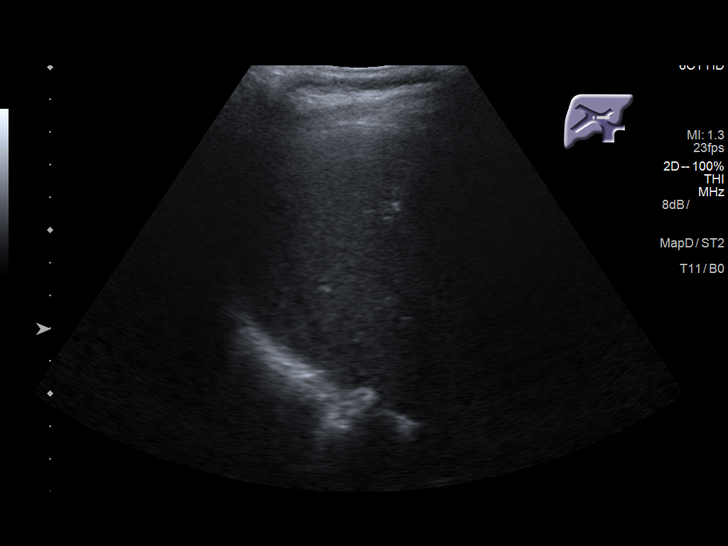
[im 33/99]
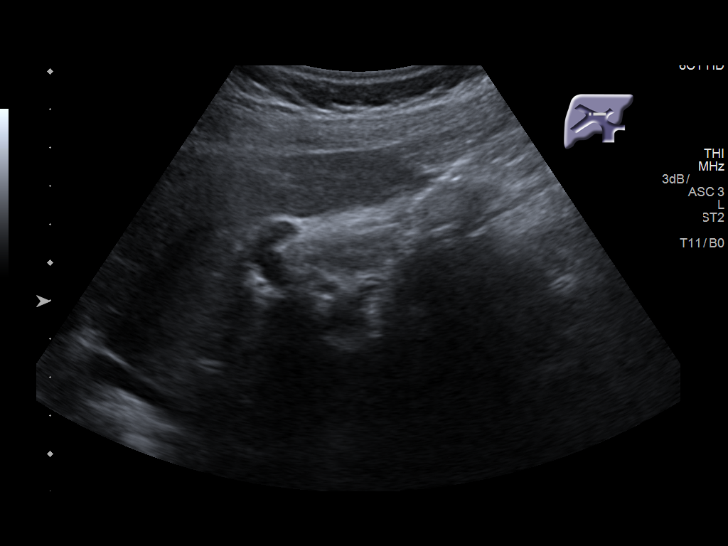
[im 37/99]
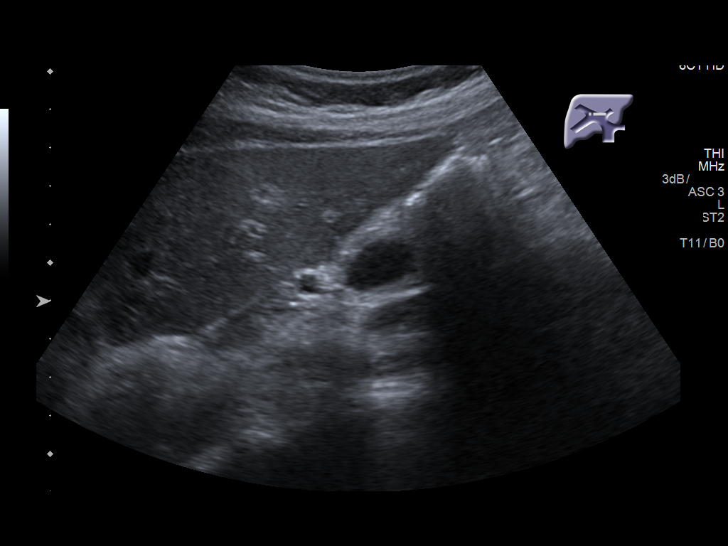
[im 45/99]
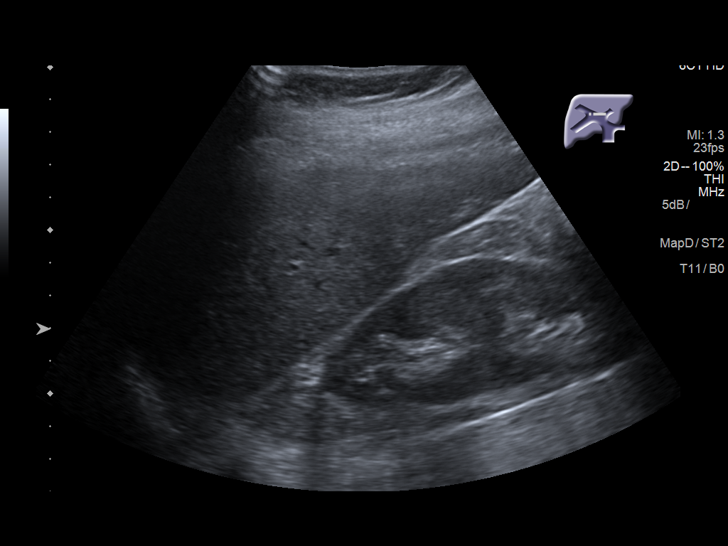
[im 54/99]
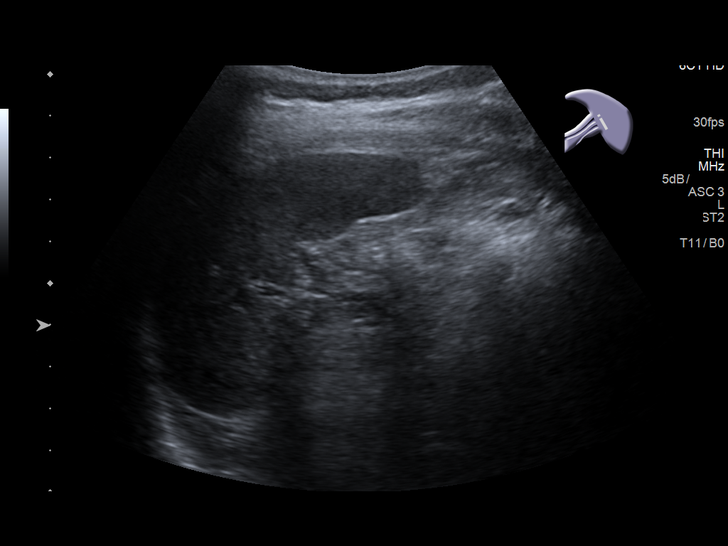
[im 62/99]
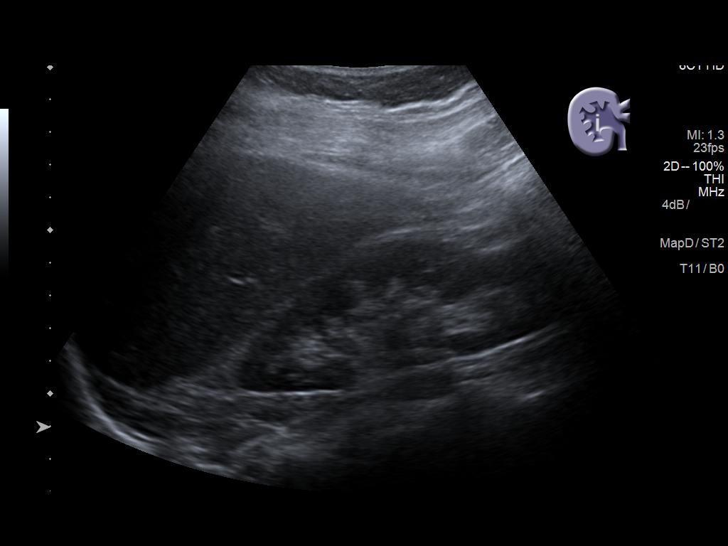
[im 66/99]
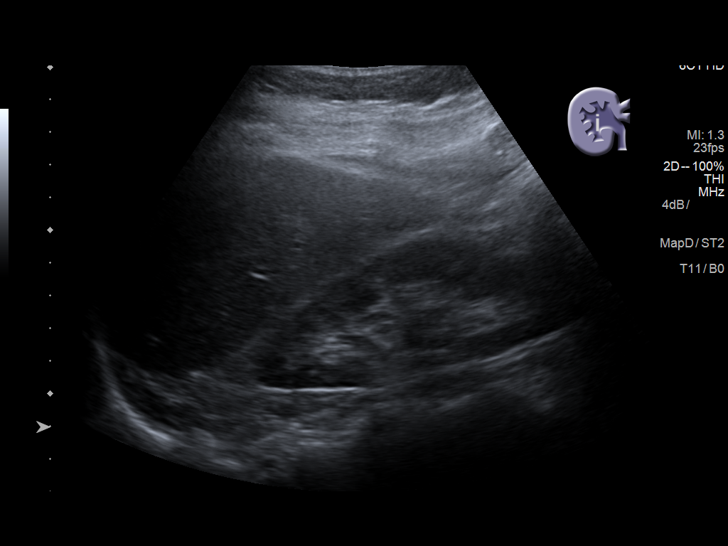
[im 74/99]
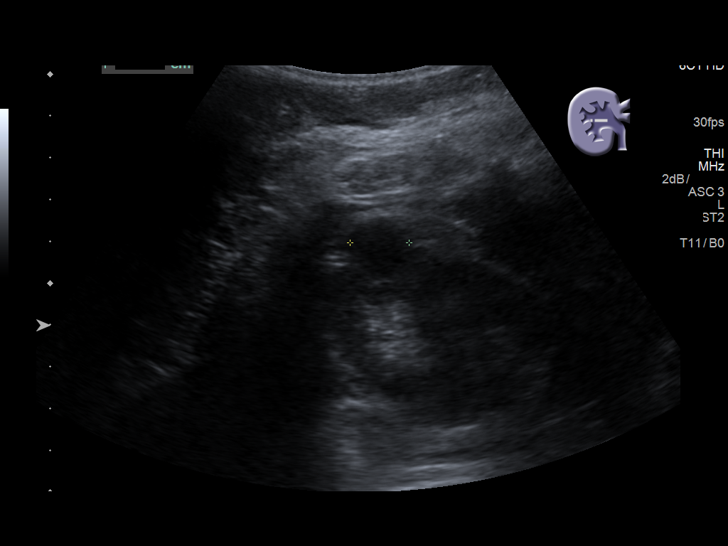
[im 82/99]
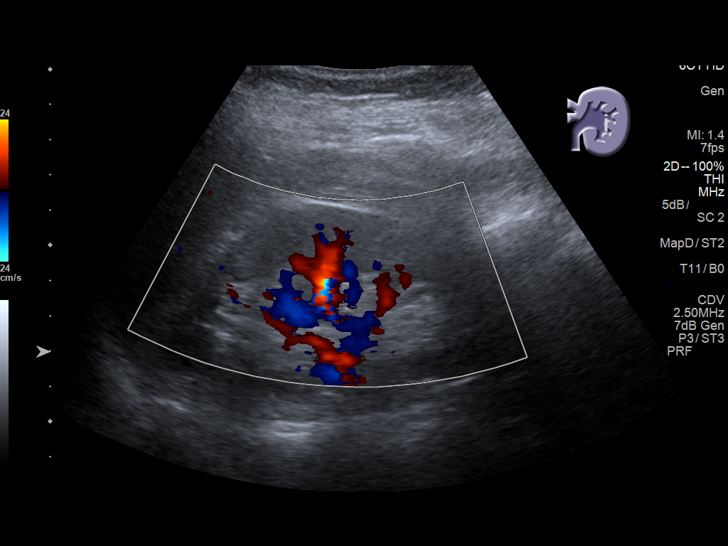
[im 90/99]
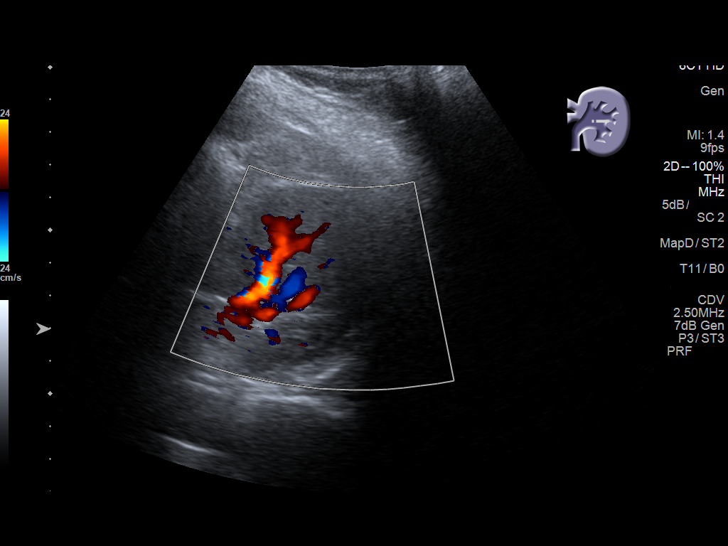
[im 99/99]
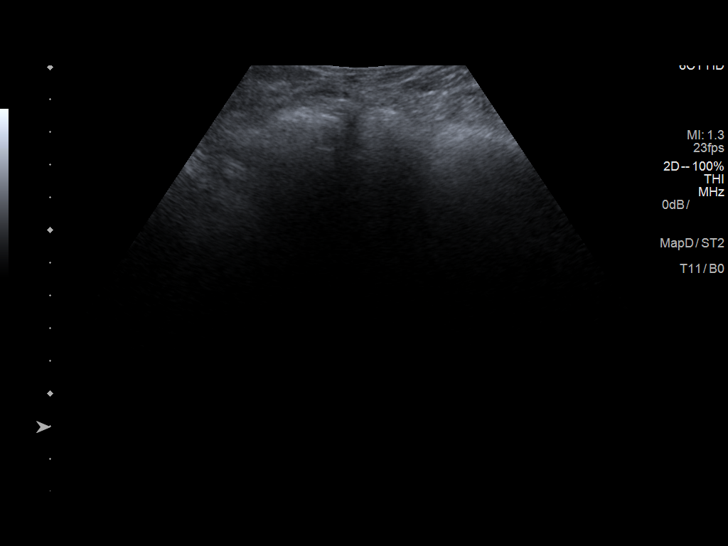

[14 of 25 positions shown; findings below may reference images not displayed]

FINDINGS: Gallbladder: Multiple gallstones, measuring up to 10 mm. No
gallbladder wall thickening or pericholecystic fluid. Negative
sonographic Murphy's sign.

Common bile duct: Diameter: 3 mm

Liver: No focal lesion identified. Within normal limits in
parenchymal echogenicity. Portal vein is patent on color Doppler
imaging with normal direction of blood flow towards the liver.

IVC: No abnormality visualized.

Pancreas: Not discretely visualized due to overlying bowel gas.

Spleen: Size and appearance within normal limits.

Right Kidney: Length: 10.5 cm. 1.5 x 1.5 x 1.4 cm simple interpolar
cyst. No hydronephrosis.

Left Kidney: Length: 10.5 cm.  No mass or hydronephrosis.

Abdominal aorta: No aneurysm visualized.

Other findings: None.
IMPRESSION: Cholelithiasis, without associated sonographic findings to suggest
acute cholecystitis.

1.5 cm simple right renal cyst.

## 2018-01-31 ENCOUNTER — Encounter: Payer: Self-pay | Admitting: Pulmonary Disease

## 2018-01-31 ENCOUNTER — Telehealth: Payer: Self-pay | Admitting: Pulmonary Disease

## 2018-01-31 NOTE — Telephone Encounter (Signed)
3 attempts to schedule fu appt from recall list.  Mailed Letter. Deleting recall.  ° °

## 2019-09-08 ENCOUNTER — Emergency Department
Admission: EM | Admit: 2019-09-08 | Discharge: 2019-09-08 | Discharge status: Against Medical Advice | Payer: Medicaid Other

## 2021-06-23 ENCOUNTER — Telehealth (HOSPITAL_COMMUNITY): Payer: Self-pay | Admitting: Emergency Medicine

## 2021-06-23 ENCOUNTER — Ambulatory Visit (HOSPITAL_COMMUNITY)
Admission: EM | Admit: 2021-06-23 | Discharge: 2021-06-23 | Disposition: A | Discharge status: Home / Self Care | Payer: Medicaid Other | Attending: Emergency Medicine | Admitting: Emergency Medicine

## 2021-06-23 ENCOUNTER — Encounter (HOSPITAL_COMMUNITY): Payer: Self-pay | Admitting: Emergency Medicine

## 2021-06-23 ENCOUNTER — Other Ambulatory Visit: Payer: Self-pay

## 2021-06-23 ENCOUNTER — Ambulatory Visit (HOSPITAL_COMMUNITY)
Admission: EM | Admit: 2021-06-23 | Discharge: 2021-06-23 | Disposition: A | Discharge status: Home / Self Care | Payer: Medicaid Other

## 2021-06-23 DIAGNOSIS — Z20822 Contact with and (suspected) exposure to covid-19: Secondary | ICD-10-CM | POA: Insufficient documentation

## 2021-06-23 DIAGNOSIS — J302 Other seasonal allergic rhinitis: Secondary | ICD-10-CM | POA: Diagnosis not present

## 2021-06-23 DIAGNOSIS — R0981 Nasal congestion: Secondary | ICD-10-CM

## 2021-06-23 LAB — SARS CORONAVIRUS 2 (TAT 6-24 HRS): SARS Coronavirus 2: NEGATIVE

## 2021-06-23 NOTE — ED Provider Notes (Signed)
[facility]    CSN: [id number] Arrival date & time: 06/23/21  2979      History   Chief Complaint Chief Complaint  Patient presents with   Nasal Congestion    HPI [name] Kirby is a 50 y.o. female.   Patient here for evaluation of nasal congestion that has been ongoing for the past week.  Reports that she believes it is just her allergies as she has not been taking her allergy medication daily but would like to be screened for COVID just to be sure.  Denies any recent sick contacts.  Denies any fevers at home.  Denies any trauma, injury, or other precipitating event.  Denies any specific alleviating or aggravating factors.  Denies any fevers, chest pain, shortness of breath, N/V/D, numbness, tingling, weakness, abdominal pain, or headaches.    The history is provided by the patient.   Past Medical History:  Diagnosis Date   Abnormal Pap smear    Anxiety    Asthma    Depression    bipolar   Gallstones    Gonorrhea    Headache(784.0)    Trichomonas     There are no problems to display for this patient.   Past Surgical History:  Procedure Laterality Date   CESAREAN SECTION  01/12/2012   Procedure: CESAREAN SECTION;  Surgeon: Adam Phenix, MD;  Location: [facility];  Service: Gynecology;  Laterality: N/A;   EYE SURGERY Left 10/31/1975   EYE SURGERY     GALLBLADDER SURGERY     GYNECOLOGIC CRYOSURGERY      OB History     Gravida  3   Para  3   Term  3   Preterm  0   AB  0   Living  3      SAB  0   IAB  0   Ectopic  0   Multiple  0   Live Births  3            Home Medications    Prior to Admission medications   Medication Sig Start Date End Date Taking? Authorizing Provider  fluticasone (FLONASE) 50 MCG/ACT nasal spray Place into the nose. 08/09/20 08/09/21 Yes [provider]  loratadine (CLARITIN) 10 MG tablet Take 1 tablet by mouth daily. 08/09/20 08/09/21 Yes [provider]  omeprazole  (PRILOSEC) 20 MG capsule Take by mouth. 08/09/20  Yes [provider]  albuterol (PROVENTIL HFA;VENTOLIN HFA) 108 (90 Base) MCG/ACT inhaler Inhale 1-2 puffs into the lungs every 6 (six) hours as needed for wheezing or shortness of breath.    [provider]  ibuprofen (ADVIL,MOTRIN) 600 MG tablet Take 1 tablet (600 mg total) by mouth every 6 (six) hours as needed. Patient taking differently: Take 600 mg by mouth every 6 (six) hours as needed for moderate pain. 07/01/17   Mathews Robinsons B, PA-C  naproxen (NAPROSYN) 500 MG tablet Take 1 tablet (500 mg total) by mouth 2 (two) times daily as needed for moderate pain. 08/27/17   Ward, Chase Picket, PA-C  ondansetron (ZOFRAN ODT) 4 MG disintegrating tablet Take 1 tablet (4 mg total) by mouth every 8 (eight) hours as needed for nausea or vomiting. 08/27/17   Ward, Chase Picket, PA-C  sertraline (ZOLOFT) 50 MG tablet Take 50 mg by mouth daily. Reported on 04/03/2016    [provider]  traZODone (DESYREL) 50 MG tablet Take 50 mg by mouth at bedtime as needed for sleep.  [provider]    Family History Family History  Problem Relation Age of Onset   Diabetes Mother    Hypertension Mother    Diabetes Maternal Grandmother    Kidney disease Maternal Grandmother    Anesthesia problems Neg Hx    Hypotension Neg Hx    Pseudochol deficiency Neg Hx    Malignant hyperthermia Neg Hx     Social History Social History   Tobacco Use   Smoking status: Former    Packs/day: 0.25    Types: Cigarettes   Smokeless tobacco: Never   Tobacco comments:    quit in around 1993  Substance Use Topics   Alcohol use: No   Drug use: No     Allergies   Other and Triamcinolone   Review of Systems Review of Systems  Constitutional:  Negative for fever.  HENT:  Positive for congestion.   Respiratory:  Negative for cough and shortness of breath.   All other systems reviewed and are negative.   Physical Exam Triage  Vital Signs ED Triage Vitals  Enc Vitals Group     BP 06/23/21 1030 129/85     Pulse Rate 06/23/21 1030 77     Resp 06/23/21 1030 17     Temp 06/23/21 1030 98.4 F (36.9 C)     Temp Source 06/23/21 1030 Oral     SpO2 06/23/21 1030 99 %     Weight --      Height --      Head Circumference --      Peak Flow --      Pain Score 06/23/21 1036 0     Pain Loc --      Pain Edu? --      Excl. in GC? --    No data found.  Updated Vital Signs BP 129/85 (BP Location: Left Arm)   Pulse 77   Temp 98.4 F (36.9 C) (Oral)   Resp 17   SpO2 99%   Visual Acuity Right Eye Distance:   Left Eye Distance:   Bilateral Distance:    Right Eye Near:   Left Eye Near:    Bilateral Near:     Physical Exam Vitals and nursing note reviewed.  Constitutional:      General: She is not in acute distress.    Appearance: Normal appearance. She is not ill-appearing, toxic-appearing or diaphoretic.  HENT:     Head: Normocephalic and atraumatic.     Nose: Congestion present.     Mouth/Throat:     Mouth: Mucous membranes are moist.     Pharynx: Oropharynx is clear.  Eyes:     Conjunctiva/sclera: Conjunctivae normal.  Cardiovascular:     Rate and Rhythm: Normal rate and regular rhythm.     Pulses: Normal pulses.     Heart sounds: Normal heart sounds.  Pulmonary:     Effort: Pulmonary effort is normal.     Breath sounds: Normal breath sounds.  Abdominal:     General: Abdomen is flat.  Musculoskeletal:        General: Normal range of motion.     Cervical back: Normal range of motion.  Skin:    General: Skin is warm and dry.  Neurological:     General: No focal deficit present.     Mental Status: She is alert and oriented to person, place, and time.  Psychiatric:        Mood and Affect: Mood normal.     UC Treatments /  Results  Labs (all labs ordered are listed, but only abnormal results are displayed) Labs Reviewed  SARS CORONAVIRUS 2 (TAT 6-24 HRS)    EKG   Radiology No  results found.  Procedures Procedures (including critical care time)  Medications Ordered in UC Medications - No data to display  Initial Impression / Assessment and Plan / UC Course  I have reviewed the triage vital signs and the nursing notes.  Pertinent labs & imaging results that were available during my care of the patient were reviewed by me and considered in my medical decision making (see chart for details).    Assessment negative for red flags or concerns.  Likely nasal congestion related to seasonal allergies.  Will obtain COVID test per patient request.  Encourage patient to take allergy medications as prescribed.  Follow-up as needed Final Clinical Impressions(s) / UC Diagnoses   Final diagnoses:  Nasal congestion  Seasonal allergies     Discharge Instructions      Take your allergy medication as prescribed.   We will contact you if your COVID test is positive.  It will also appear in your MyChart  Return or go to the Emergency Department if symptoms worsen or do not improve in the next few days.      ED Prescriptions   None    PDMP not reviewed this encounter.   Ivette Loyal, NP 06/23/21 1054

## 2021-06-23 NOTE — ED Triage Notes (Signed)
Pt is present today with nasal congestion. Pt states that her sx one week ago.

## 2021-06-23 NOTE — Telephone Encounter (Signed)
Open in error

## 2021-06-23 NOTE — Discharge Instructions (Addendum)
Take your allergy medication as prescribed.   We will contact you if your COVID test is positive.  It will also appear in your MyChart  Return or go to the Emergency Department if symptoms worsen or do not improve in the next few days.

## 2021-06-23 NOTE — ED Notes (Signed)
Pt arrived for covid test recollect.

## 2021-06-23 NOTE — Telephone Encounter (Signed)
Pt has been notified that she needs to come back to UC for a re swab for Covid

## 2021-06-23 NOTE — ED Notes (Signed)
Pt was here for covid re swab.

## 2021-09-25 ENCOUNTER — Other Ambulatory Visit: Payer: Self-pay

## 2021-09-25 ENCOUNTER — Encounter (HOSPITAL_COMMUNITY): Payer: Self-pay | Admitting: Emergency Medicine

## 2021-09-25 ENCOUNTER — Emergency Department (HOSPITAL_COMMUNITY): Payer: Medicaid Other

## 2021-09-25 ENCOUNTER — Emergency Department (HOSPITAL_COMMUNITY)
Admission: EM | Admit: 2021-09-25 | Discharge: 2021-09-25 | Disposition: A | Discharge status: Home / Self Care | Payer: Medicaid Other | Attending: Emergency Medicine | Admitting: Emergency Medicine

## 2021-09-25 DIAGNOSIS — Z87891 Personal history of nicotine dependence: Secondary | ICD-10-CM | POA: Diagnosis not present

## 2021-09-25 DIAGNOSIS — M79675 Pain in left toe(s): Secondary | ICD-10-CM | POA: Insufficient documentation

## 2021-09-25 DIAGNOSIS — R1031 Right lower quadrant pain: Secondary | ICD-10-CM | POA: Insufficient documentation

## 2021-09-25 DIAGNOSIS — J45909 Unspecified asthma, uncomplicated: Secondary | ICD-10-CM | POA: Insufficient documentation

## 2021-09-25 DIAGNOSIS — R109 Unspecified abdominal pain: Secondary | ICD-10-CM

## 2021-09-25 DIAGNOSIS — N9489 Other specified conditions associated with female genital organs and menstrual cycle: Secondary | ICD-10-CM | POA: Insufficient documentation

## 2021-09-25 LAB — URINALYSIS, ROUTINE W REFLEX MICROSCOPIC
Bilirubin Urine: NEGATIVE
Glucose, UA: NEGATIVE mg/dL
Hgb urine dipstick: NEGATIVE
Ketones, ur: NEGATIVE mg/dL
Leukocytes,Ua: NEGATIVE
Nitrite: NEGATIVE
Protein, ur: NEGATIVE mg/dL
Specific Gravity, Urine: 1.006 (ref 1.005–1.030)
pH: 7 (ref 5.0–8.0)

## 2021-09-25 LAB — CBC WITH DIFFERENTIAL/PLATELET
Abs Immature Granulocytes: 0.01 10*3/uL (ref 0.00–0.07)
Basophils Absolute: 0 10*3/uL (ref 0.0–0.1)
Basophils Relative: 1 %
Eosinophils Absolute: 0.1 10*3/uL (ref 0.0–0.5)
Eosinophils Relative: 3 %
HCT: 42.7 % (ref 36.0–46.0)
Hemoglobin: 14 g/dL (ref 12.0–15.0)
Immature Granulocytes: 0 %
Lymphocytes Relative: 31 %
Lymphs Abs: 1.4 10*3/uL (ref 0.7–4.0)
MCH: 32 pg (ref 26.0–34.0)
MCHC: 32.8 g/dL (ref 30.0–36.0)
MCV: 97.5 fL (ref 80.0–100.0)
Monocytes Absolute: 0.5 10*3/uL (ref 0.1–1.0)
Monocytes Relative: 12 %
Neutro Abs: 2.4 10*3/uL (ref 1.7–7.7)
Neutrophils Relative %: 53 %
Platelets: 335 10*3/uL (ref 150–400)
RBC: 4.38 MIL/uL (ref 3.87–5.11)
RDW: 13.7 % (ref 11.5–15.5)
WBC: 4.4 10*3/uL (ref 4.0–10.5)
nRBC: 0 % (ref 0.0–0.2)

## 2021-09-25 LAB — COMPREHENSIVE METABOLIC PANEL
ALT: <5 U/L (ref 0–44)
AST: 19 U/L (ref 15–41)
Albumin: 3.6 g/dL (ref 3.5–5.0)
Alkaline Phosphatase: 49 U/L (ref 38–126)
Anion gap: 7 (ref 5–15)
BUN: 6 mg/dL (ref 6–20)
CO2: 24 mmol/L (ref 22–32)
Calcium: 9.1 mg/dL (ref 8.9–10.3)
Chloride: 105 mmol/L (ref 98–111)
Creatinine, Ser: 0.73 mg/dL (ref 0.44–1.00)
GFR, Estimated: >60 mL/min (ref >60)
Glucose, Bld: 86 mg/dL (ref 70–99)
Potassium: 4.2 mmol/L (ref 3.5–5.1)
Sodium: 136 mmol/L (ref 135–145)
Total Bilirubin: 1.2 mg/dL (ref 0.3–1.2)
Total Protein: 7.1 g/dL (ref 6.5–8.1)

## 2021-09-25 LAB — I-STAT BETA HCG BLOOD, ED (MC, WL, AP ONLY): I-stat hCG, quantitative: <5 m[IU]/mL (ref <5)

## 2021-09-25 LAB — LIPASE, BLOOD: Lipase: 25 U/L (ref 11–51)

## 2021-09-25 NOTE — ED Triage Notes (Signed)
C/o R sided abd pain and intermittent nausea x 1 week.  Denies vomiting, diarrhea, and urinary complaints.  Also reports L 4th and 5th toe pain since stumping her toe on Wednesday.

## 2021-09-25 NOTE — Discharge Instructions (Signed)
Your x-ray was negative, showing no broken bone or fracture.  Use Tylenol ibuprofen as needed for pain. Your blood work today was reassuring.  No signs of intra-abdominal infection.  Your urine was negative for infection or blood, indicating you do not have a UTI or kidney stone. Treat your abdominal pain symptomatically, using Tylenol and ibuprofen.  Follow-up with your primary care doctor as needed for recheck of your symptoms. Return to the emergency room if you develop fevers, persistent vomiting, severe worsening pain, inability urinate, or any new, worsening, or concerning symptoms for

## 2021-09-25 NOTE — ED Provider Notes (Signed)
Emergency Medicine Provider Triage Evaluation Note  [name] Kirby , a 50 y.o. female  was evaluated in triage.  Pt complains of abd pain, toe pain.  Review of Systems  Positive: RLQ abd pain x 1 week, left 4th toe pain Negative: Fever, n/v/d, dysuria, vaginal bleeding, vaginal discharge  Physical Exam  BP 137/89 (BP Location: Right Arm)   Pulse 87   Temp 98 F (36.7 C) (Oral)   Resp 18   SpO2 98%  Gen:   Awake, no distress   Resp:  Normal effort  MSK:   Moves extremities without difficulty  Other:    Medical Decision Making  Medically screening exam initiated at 10:29 AM.  Appropriate orders placed.  [name] Kirby was informed that the remainder of the evaluation will be completed by another provider, this initial triage assessment does not replace that evaluation, and the importance of remaining in the ED until their evaluation is complete.  Pt stump her L 4th toe several days ago, think she may have broken it.  Also report pain to RLQ x 1 week without other associated sxs.     Fayrene Helper, PA-C 09/25/21 1031    Gloris Manchester, MD 09/27/21 [phone]

## 2021-09-25 NOTE — ED Provider Notes (Signed)
[facility] EMERGENCY DEPARTMENT Provider Note   CSN: [id number] Arrival date & time: 09/25/21  0944     History Chief Complaint  Patient presents with   Abdominal Pain   Toe Pain    [name] Kirby is a 50 y.o. female presenting for evaluation of abdominal pain and toe pain.  Patient states 6 days ago she stubbed her left fourth and fifth toe.  She had mild swelling and bruising, and reinjured it the same way 2 days later.  She has been using over-the-counter pain relief with mild improvement.  She is here today to make sure it was not broken, no worsening pain.  Does not radiate.  No numbness or tingling.  Additionally, patient reports for the past 1.5 weeks she has had intermittent right lower quadrant abdominal pain.  More likely to happen at night when she is laying flat.  Occasionally has associated nausea.  She is not having any pain or nausea currently.  She denies fevers, chills, cough, chest pain, shortness of breath, vomiting, urinary symptoms, normal bowel movements.  She is sexually active with 1 female partner, does not always use condoms.  Is not having any vaginal discharge.  Has irregular periods, last period was 2 weeks ago and it was 2 days long.  She has not taken anything for her abdominal pain.   HPI     Past Medical History:  Diagnosis Date   Abnormal Pap smear    Anxiety    Asthma    Depression    bipolar   Gallstones    Gonorrhea    Headache(784.0)    Trichomonas     There are no problems to display for this patient.   Past Surgical History:  Procedure Laterality Date   CESAREAN SECTION  01/12/2012   Procedure: CESAREAN SECTION;  Surgeon: Adam Phenix, MD;  Location: [facility];  Service: Gynecology;  Laterality: N/A;   EYE SURGERY Left 10/31/1975   EYE SURGERY     GALLBLADDER SURGERY     GYNECOLOGIC CRYOSURGERY       OB History     Gravida  3   Para  3   Term  3   Preterm  0   AB  0   Living  3       SAB  0   IAB  0   Ectopic  0   Multiple  0   Live Births  3           Family History  Problem Relation Age of Onset   Diabetes Mother    Hypertension Mother    Diabetes Maternal Grandmother    Kidney disease Maternal Grandmother    Anesthesia problems Neg Hx    Hypotension Neg Hx    Pseudochol deficiency Neg Hx    Malignant hyperthermia Neg Hx     Social History   Tobacco Use   Smoking status: Former    Packs/day: 0.25    Types: Cigarettes   Smokeless tobacco: Never   Tobacco comments:    quit in around 1993  Substance Use Topics   Alcohol use: No   Drug use: No    Home Medications Prior to Admission medications   Medication Sig Start Date End Date Taking? Authorizing Provider  albuterol (PROVENTIL HFA;VENTOLIN HFA) 108 (90 Base) MCG/ACT inhaler Inhale 1-2 puffs into the lungs every 6 (six) hours as needed for wheezing or shortness of breath.    [provider]  ibuprofen (ADVIL,MOTRIN) 600 MG tablet Take 1 tablet (600 mg total) by mouth every 6 (six) hours as needed. Patient taking differently: Take 600 mg by mouth every 6 (six) hours as needed for moderate pain. 07/01/17   Mathews Robinsons B, PA-C  loratadine (CLARITIN) 10 MG tablet Take 1 tablet by mouth daily. 08/09/20 08/09/21  [provider]  naproxen (NAPROSYN) 500 MG tablet Take 1 tablet (500 mg total) by mouth 2 (two) times daily as needed for moderate pain. 08/27/17   Ward, Chase Picket, PA-C  omeprazole (PRILOSEC) 20 MG capsule Take by mouth. 08/09/20   [provider]  ondansetron (ZOFRAN ODT) 4 MG disintegrating tablet Take 1 tablet (4 mg total) by mouth every 8 (eight) hours as needed for nausea or vomiting. 08/27/17   Ward, Chase Picket, PA-C  sertraline (ZOLOFT) 50 MG tablet Take 50 mg by mouth daily. Reported on 04/03/2016    [provider]  traZODone (DESYREL) 50 MG tablet Take 50 mg by mouth at bedtime as needed for sleep.     [provider]     Allergies    Other and Triamcinolone  Review of Systems   Review of Systems  Gastrointestinal:  Positive for abdominal pain (intermittent).  Musculoskeletal:  Positive for arthralgias.  All other systems reviewed and are negative.  Physical Exam Updated Vital Signs BP 131/90 (BP Location: Right Arm)   Pulse 74   Temp 98.1 F (36.7 C) (Oral)   Resp 18   SpO2 100%   Physical Exam Vitals and nursing note reviewed.  Constitutional:      General: She is not in acute distress.    Appearance: Normal appearance.     Comments: Resting in the bed in NAD  HENT:     Head: Normocephalic and atraumatic.  Eyes:     Conjunctiva/sclera: Conjunctivae normal.     Pupils: Pupils are equal, round, and reactive to light.  Cardiovascular:     Rate and Rhythm: Normal rate and regular rhythm.     Pulses: Normal pulses.  Pulmonary:     Effort: Pulmonary effort is normal. No respiratory distress.     Breath sounds: Normal breath sounds. No wheezing.     Comments: Speaking in full sentences.  Clear lung sounds in all fields. Abdominal:     General: There is no distension.     Palpations: Abdomen is soft. There is no mass.     Tenderness: There is no abdominal tenderness. There is no guarding or rebound.     Comments: No ttp of the abd. No rigidity, guarding, distention.  Negative rebound.  No peritonitis.  No CVA tenderness.  Musculoskeletal:        General: Tenderness present. Normal range of motion.     Cervical back: Normal range of motion and neck supple.     Comments: Mild ttp of the distal L forth toe. No deformity. No injury or pain elsewhere in the foot.   Skin:    General: Skin is warm and dry.     Capillary Refill: Capillary refill takes less than 2 seconds.  Neurological:     Mental Status: She is alert and oriented to person, place, and time.  Psychiatric:        Mood and Affect: Mood and affect normal.        Speech: Speech normal.        Behavior: Behavior normal.     ED Results / Procedures / Treatments   Labs (all labs  ordered are listed, but only abnormal results are displayed) Labs Reviewed  URINALYSIS, ROUTINE W REFLEX MICROSCOPIC - Abnormal; Notable for the following components:      Result Value   Color, Urine STRAW (*)    All other components within normal limits  CBC WITH DIFFERENTIAL/PLATELET  COMPREHENSIVE METABOLIC PANEL  LIPASE, BLOOD  I-STAT BETA HCG BLOOD, ED ([facility], [facility], [facility] ONLY)    EKG None  Radiology DG Foot Complete Left  Result Date: 09/25/2021 CLINICAL DATA:  Toe injury on the right. Fourth and fifth digit pain. EXAM: LEFT FOOT - COMPLETE 3+ VIEW COMPARISON:  05/18/2017 FINDINGS: There is no evidence of fracture or dislocation. There is no evidence of arthropathy or other focal bone abnormality. Soft tissues are unremarkable. IMPRESSION: Negative. Electronically Signed   By: Tiburcio Pea M.D.   On: 09/25/2021 11:51    Procedures Procedures   Medications Ordered in ED Medications - No data to display  ED Course  I have reviewed the triage vital signs and the nursing notes.  Pertinent labs & imaging results that were available during my care of the patient were reviewed by me and considered in my medical decision making (see chart for details).    MDM Rules/Calculators/A&P                           Patient presenting for evaluation of toe and abdominal pain.  On exam, patient appears nontoxic.  Distal left fourth toe has some mild tenderness, but no obvious deformity and patient is neurovascular intact.  X-rays obtained from triage viewed and independently turbid by me, no fracture or dislocation. Regarding patient's abdominal pain, she is not having any currently.  It is intermittent.  Labs obtained from triage overall reassuring.  No kidney, liver, or pancreatic function abnormalities.  Leukocytosis.  Hemoglobin is stable.  Urine without signs of infection or blood.  As such, doubt UTI or kidney stone.  Doubt  intra-abdominal infection considering symptoms are intermittent and patient is afebrile and nontender currently.  Discussed findings with patient.  Discussed that at this time, there does not appear to be an acute or life-threatening condition requiring hospitalization.  Exam is not consistent with intraabdominal infection, perforation, obstruction, or surgical abdomen.  Will have patient continue to treat symptomatically and follow-up with PCP.  At this time, patient appears safe for discharge.  Return precautions given.  Patient states she understands and agrees to plan  Final Clinical Impression(s) / ED Diagnoses Final diagnoses:  Intermittent abdominal pain  Toe pain, left    Rx / DC Orders ED Discharge Orders     None        Alveria Apley, PA-C 09/25/21 1317    Gloris Manchester, MD 09/27/21 [phone]

## 2021-12-13 ENCOUNTER — Ambulatory Visit
Admission: EM | Admit: 2021-12-13 | Discharge: 2021-12-13 | Disposition: A | Discharge status: Home / Self Care | Payer: Medicaid Other

## 2021-12-13 ENCOUNTER — Other Ambulatory Visit: Payer: Self-pay

## 2021-12-14 ENCOUNTER — Ambulatory Visit (HOSPITAL_COMMUNITY)
Admission: EM | Admit: 2021-12-14 | Discharge: 2021-12-14 | Disposition: A | Discharge status: Home / Self Care | Payer: Medicaid Other | Attending: Urgent Care | Admitting: Urgent Care

## 2021-12-14 ENCOUNTER — Other Ambulatory Visit: Payer: Self-pay

## 2021-12-14 ENCOUNTER — Encounter (HOSPITAL_COMMUNITY): Payer: Self-pay | Admitting: Emergency Medicine

## 2021-12-14 DIAGNOSIS — T148XXA Other injury of unspecified body region, initial encounter: Secondary | ICD-10-CM

## 2021-12-14 MED ORDER — NAPROXEN 500 MG PO TABS: 500.0000 mg | ORAL_TABLET | Freq: Two times a day (BID) | ORAL | 0 refills | Status: AC

## 2021-12-14 MED ORDER — TIZANIDINE HCL 4 MG PO TABS: 4.0000 mg | ORAL_TABLET | Freq: Four times a day (QID) | ORAL | 0 refills | Status: DC | PRN

## 2021-12-14 NOTE — ED Triage Notes (Signed)
Pt reports right leg pain intermittently since December.  States hx of blood clots and wants to determine if blood clots have returned.

## 2021-12-14 NOTE — ED Provider Notes (Signed)
[place]    CSN: [id number] Arrival date & time: 12/14/21  1929      History   Chief Complaint Chief Complaint  Patient presents with   Leg Pain    HPI [name] Kirby is a 51 y.o. female.   Pleasant 51 year old female presents today with a several month history of intermittent right anterior thigh/inguinal pain.  She states between July and October 2022 she had the pain, but resolved without any treatment.  She did not seek medical attention at that time.  She states as of December 2022, she started developing the pain again.  She describes it as a dull intermittent crampy pain, with episodes of sharp pain.  In December, she moved to a hotel and has been living in a hotel since then.  She has to climb 3 flights of stairs to get to her hotel room, and attributed the thigh pain to that.  She states it is located primarily to the superior portion of her right anterior thigh, occasionally going to the medial aspect.  She denies any decreased range of motion, but notes that climbing stairs seems to make it worse.  Resting seems to make it better.  She denies any radiation down her leg.  She denies any radicular symptoms.  She denies any low back pain.  She denies any erythema or swelling of the skin.  She has not tried any treatments for her symptoms.   Leg Pain  Past Medical History:  Diagnosis Date   Abnormal Pap smear    Anxiety    Asthma    Depression    bipolar   Gallstones    Gonorrhea    Headache(784.0)    Trichomonas     There are no problems to display for this patient.   Past Surgical History:  Procedure Laterality Date   CESAREAN SECTION  01/12/2012   Procedure: CESAREAN SECTION;  Surgeon: Woodroe Mode, MD;  Location: [facility];  Service: Gynecology;  Laterality: N/A;   EYE SURGERY Left 10/31/1975   EYE SURGERY     GALLBLADDER SURGERY     GYNECOLOGIC CRYOSURGERY      OB History     Gravida  3   Para  3   Term  3   Preterm  0    AB  0   Living  3      SAB  0   IAB  0   Ectopic  0   Multiple  0   Live Births  3            Home Medications    Prior to Admission medications   Medication Sig Start Date End Date Taking? Authorizing Provider  tiZANidine (ZANAFLEX) 4 MG tablet Take 1 tablet (4 mg total) by mouth every 6 (six) hours as needed (muscle pain/ spasms). 12/14/21  Yes Dalena Plantz L, PA  albuterol (PROVENTIL HFA;VENTOLIN HFA) 108 (90 Base) MCG/ACT inhaler Inhale 1-2 puffs into the lungs every 6 (six) hours as needed for wheezing or shortness of breath.    [provider]  loratadine (CLARITIN) 10 MG tablet Take 1 tablet by mouth daily. 08/09/20 08/09/21  [provider]  naproxen (NAPROSYN) 500 MG tablet Take 1 tablet (500 mg total) by mouth 2 (two) times daily with a meal. 12/14/21   Marchella Hibbard L, PA  omeprazole (PRILOSEC) 20 MG capsule Take by mouth. 08/09/20   [provider]  ondansetron (ZOFRAN ODT) 4 MG disintegrating tablet Take  1 tablet (4 mg total) by mouth every 8 (eight) hours as needed for nausea or vomiting. 08/27/17   Ward, Chase Picket, PA-C  sertraline (ZOLOFT) 50 MG tablet Take 50 mg by mouth daily. Reported on 04/03/2016    [provider]  traZODone (DESYREL) 50 MG tablet Take 50 mg by mouth at bedtime as needed for sleep.     [provider]    Family History Family History  Problem Relation Age of Onset   Diabetes Mother    Hypertension Mother    Diabetes Maternal Grandmother    Kidney disease Maternal Grandmother    Anesthesia problems Neg Hx    Hypotension Neg Hx    Pseudochol deficiency Neg Hx    Malignant hyperthermia Neg Hx     Social History Social History   Tobacco Use   Smoking status: Former    Packs/day: 0.25    Types: Cigarettes   Smokeless tobacco: Never   Tobacco comments:    quit in around 1993  Substance Use Topics   Alcohol use: No   Drug use: No     Allergies   Other and  Triamcinolone   Review of Systems Review of Systems  Musculoskeletal:  Positive for myalgias (R anterior thigh).  All other systems reviewed and are negative.   Physical Exam Triage Vital Signs ED Triage Vitals  Enc Vitals Group     BP 12/14/21 1948 131/72     Pulse Rate 12/14/21 1948 90     Resp 12/14/21 1948 18     Temp 12/14/21 1948 98.2 F (36.8 C)     Temp Source 12/14/21 1948 Oral     SpO2 12/14/21 1948 100 %     Weight 12/14/21 1946 156 lb 1.4 oz (70.8 kg)     Height 12/14/21 1946 5\' 3"  (1.6 m)     Head Circumference --      Peak Flow --      Pain Score 12/14/21 1946 8     Pain Loc --      Pain Edu? --      Excl. in GC? --    No data found.  Updated Vital Signs BP 131/72 (BP Location: Right Arm)    Pulse 90    Temp 98.2 F (36.8 C) (Oral)    Resp 18    Ht 5\' 3"  (1.6 m)    Wt 156 lb 1.4 oz (70.8 kg)    SpO2 100%    BMI 27.65 kg/m   Visual Acuity Right Eye Distance:   Left Eye Distance:   Bilateral Distance:    Right Eye Near:   Left Eye Near:    Bilateral Near:     Physical Exam Vitals and nursing note reviewed.  Constitutional:      General: She is not in acute distress.    Appearance: She is well-developed.  HENT:     Head: Normocephalic and atraumatic.  Eyes:     Conjunctiva/sclera: Conjunctivae normal.  Cardiovascular:     Rate and Rhythm: Normal rate and regular rhythm.     Heart sounds: No murmur heard. Pulmonary:     Effort: Pulmonary effort is normal. No respiratory distress.     Breath sounds: Normal breath sounds.  Abdominal:     Palpations: Abdomen is soft.     Tenderness: There is no abdominal tenderness.  Musculoskeletal:        General: No swelling.     Cervical back: Normal range of motion and  neck supple. No rigidity or tenderness.     Right hip: Normal. No deformity, tenderness, bony tenderness or crepitus. Normal range of motion.     Left hip: Normal. No deformity, tenderness, bony tenderness or crepitus. Normal range of  motion.     Right upper leg: Tenderness (tenderness to the proximal R anterior thigh, most notable to gracilis and sartorius) present. No swelling, edema, deformity, lacerations or bony tenderness.     Left upper leg: Normal. No swelling, edema, deformity, lacerations, tenderness or bony tenderness.     Right knee: Normal. No swelling, deformity, effusion, erythema, ecchymosis, bony tenderness or crepitus. Normal range of motion. No tenderness.     Left knee: Normal. No swelling, deformity, effusion, erythema, ecchymosis, bony tenderness or crepitus. Normal range of motion. No tenderness.     Right lower leg: Normal. No swelling, deformity, lacerations, tenderness or bony tenderness. No edema.     Left lower leg: Normal. No swelling, deformity, lacerations, tenderness or bony tenderness. No edema.     Right ankle: Normal.     Comments: Normal faber test  Lymphadenopathy:     Cervical: No cervical adenopathy.  Skin:    General: Skin is warm and dry.     Capillary Refill: Capillary refill takes less than 2 seconds.  Neurological:     Mental Status: She is alert.  Psychiatric:        Mood and Affect: Mood normal.     UC Treatments / Results  Labs (all labs ordered are listed, but only abnormal results are displayed) Labs Reviewed - No data to display  EKG   Radiology No results found.  Procedures Procedures (including critical care time)  Medications Ordered in UC Medications - No data to display  Initial Impression / Assessment and Plan / UC Course  I have reviewed the triage vital signs and the nursing notes.  Pertinent labs & imaging results that were available during my care of the patient were reviewed by me and considered in my medical decision making (see chart for details).     Muscle strain/ overuse syndrome - there are no concerning physical exam findings, other than mild reproducible tenderness to her sartorius and gracilis on the R. She has not tried any meds, so  therefore will start NSAID and PRN muscle relaxer. Pt has been on naproxen in the past. Understands side effects to muscle relaxers. Pts primary concern was for DVT - she has no clinical indications to suspect this, reassurance provided. Also provided handout and requested pt do the exercises discussed 3x/ week. If sx persist, she is to follow up with her PCP for further workup.   Final Clinical Impressions(s) / UC Diagnoses   Final diagnoses:  Muscle strain     Discharge Instructions      Your symptoms sound consistent with overuse syndrome to your R quadriceps, primarily your sartorius and gracilis muscles. Please use moist heat on the upper thigh. You can purchase a foam roller to roll out the muscles. Try as needed naproxen with food. You can also try muscle relaxer (tizanidine) but this can cause drowsiness. Physical therapy may be recommended to help. Follow up with PCP for PT referral if symptoms persist. Do the exercises discussed.      ED Prescriptions     Medication Sig Dispense Auth. Provider   naproxen (NAPROSYN) 500 MG tablet Take 1 tablet (500 mg total) by mouth 2 (two) times daily with a meal. 30 tablet [employer], [place] L, [place]  tiZANidine (ZANAFLEX) 4 MG tablet Take 1 tablet (4 mg total) by mouth every 6 (six) hours as needed (muscle pain/ spasms). 30 tablet Daiwik [place] L, [place]      PDMP not reviewed this encounter.   Chaney Malling, [place] 12/16/21 1110

## 2021-12-14 NOTE — Discharge Instructions (Addendum)
Your symptoms sound consistent with overuse syndrome to your R quadriceps, primarily your sartorius and gracilis muscles. Please use moist heat on the upper thigh. You can purchase a foam roller to roll out the muscles. Try as needed naproxen with food. You can also try muscle relaxer (tizanidine) but this can cause drowsiness. Physical therapy may be recommended to help. Follow up with PCP for PT referral if symptoms persist. Do the exercises discussed.

## 2022-03-27 ENCOUNTER — Encounter (HOSPITAL_COMMUNITY): Payer: Self-pay

## 2022-03-27 ENCOUNTER — Telehealth (HOSPITAL_COMMUNITY): Payer: Self-pay | Admitting: *Deleted

## 2022-03-27 ENCOUNTER — Ambulatory Visit (HOSPITAL_COMMUNITY)
Admission: RE | Admit: 2022-03-27 | Discharge: 2022-03-27 | Disposition: A | Discharge status: Home / Self Care | Payer: Medicaid Other | Source: Ambulatory Visit | Attending: Physician Assistant | Admitting: Physician Assistant

## 2022-03-27 VITALS — BP 145/88 | HR 94 | Temp 97.1°F | Resp 16

## 2022-03-27 DIAGNOSIS — M545 Low back pain, unspecified: Secondary | ICD-10-CM

## 2022-03-27 MED ORDER — TIZANIDINE HCL 4 MG PO TABS: 4.0000 mg | ORAL_TABLET | Freq: Four times a day (QID) | ORAL | 0 refills | Status: DC | PRN

## 2022-03-27 MED ORDER — TIZANIDINE HCL 4 MG PO TABS: 4.0000 mg | ORAL_TABLET | Freq: Four times a day (QID) | ORAL | 0 refills | Status: AC | PRN

## 2022-03-27 MED ORDER — KETOROLAC TROMETHAMINE 30 MG/ML IJ SOLN
30.0000 mg | Freq: Once | INTRAMUSCULAR | Status: AC
  Administered 2022-03-27: 30 mg via INTRAMUSCULAR

## 2022-03-27 MED ORDER — KETOROLAC TROMETHAMINE 30 MG/ML IJ SOLN
INTRAMUSCULAR | Status: AC
  Filled 2022-03-27: qty 1

## 2022-03-27 NOTE — Discharge Instructions (Signed)
Take tizanidine as needed for muscle spasm Recommend stretching, ice to affected area, and walking as tolerated

## 2022-03-27 NOTE — ED Triage Notes (Signed)
C/o of lower back for several days. No known injury or falls to note.

## 2022-03-27 NOTE — ED Provider Notes (Signed)
[facility]    CSN: [id number] Arrival date & time: 03/27/22  1831      History   Chief Complaint Chief Complaint  Patient presents with   Back Pain    HPI [name] Kirby is a 51 y.o. female.   Pt complains of lower back pain that started several days ago. She denies new injury or trauma.  She denies radiation of pain.  She has taken nothing for the sx.    Past Medical History:  Diagnosis Date   Abnormal Pap smear    Anxiety    Asthma    Depression    bipolar   Gallstones    Gonorrhea    Headache(784.0)    Trichomonas     There are no problems to display for this patient.   Past Surgical History:  Procedure Laterality Date   CESAREAN SECTION  01/12/2012   Procedure: CESAREAN SECTION;  Surgeon: Woodroe Mode, MD;  Location: [facility];  Service: Gynecology;  Laterality: N/A;   EYE SURGERY Left 10/31/1975   EYE SURGERY     GALLBLADDER SURGERY     GYNECOLOGIC CRYOSURGERY      OB History     Gravida  3   Para  3   Term  3   Preterm  0   AB  0   Living  3      SAB  0   IAB  0   Ectopic  0   Multiple  0   Live Births  3            Home Medications    Prior to Admission medications   Medication Sig Start Date End Date Taking? Authorizing Provider  albuterol (PROVENTIL HFA;VENTOLIN HFA) 108 (90 Base) MCG/ACT inhaler Inhale 1-2 puffs into the lungs every 6 (six) hours as needed for wheezing or shortness of breath.    [provider]  loratadine (CLARITIN) 10 MG tablet Take 1 tablet by mouth daily. 08/09/20 08/09/21  [provider]  naproxen (NAPROSYN) 500 MG tablet Take 1 tablet (500 mg total) by mouth 2 (two) times daily with a meal. 12/14/21   Crain, Whitney L, PA  omeprazole (PRILOSEC) 20 MG capsule Take by mouth. 08/09/20   [provider]  ondansetron (ZOFRAN ODT) 4 MG disintegrating tablet Take 1 tablet (4 mg total) by mouth every 8 (eight) hours as needed for nausea or vomiting.  08/27/17   Ward, Ozella Almond, PA-C  sertraline (ZOLOFT) 50 MG tablet Take 50 mg by mouth daily. Reported on 04/03/2016    [provider]  tiZANidine (ZANAFLEX) 4 MG tablet Take 1 tablet (4 mg total) by mouth every 6 (six) hours as needed (muscle pain/ spasms). 03/27/22   Ward, Lenise Arena, PA-C  traZODone (DESYREL) 50 MG tablet Take 50 mg by mouth at bedtime as needed for sleep.     [provider]    Family History Family History  Problem Relation Age of Onset   Diabetes Mother    Hypertension Mother    Diabetes Maternal Grandmother    Kidney disease Maternal Grandmother    Anesthesia problems Neg Hx    Hypotension Neg Hx    Pseudochol deficiency Neg Hx    Malignant hyperthermia Neg Hx     Social History Social History   Tobacco Use   Smoking status: Former    Packs/day: 0.25    Types: Cigarettes   Smokeless tobacco: Never   Tobacco comments:  quit in around 1993  Substance Use Topics   Alcohol use: No   Drug use: No     Allergies   Other and Triamcinolone   Review of Systems Review of Systems  Constitutional:  Negative for chills and fever.  HENT:  Negative for ear pain and sore throat.   Eyes:  Negative for pain and visual disturbance.  Respiratory:  Negative for cough and shortness of breath.   Cardiovascular:  Negative for chest pain and palpitations.  Gastrointestinal:  Negative for abdominal pain and vomiting.  Genitourinary:  Negative for dysuria and hematuria.  Musculoskeletal:  Positive for back pain. Negative for arthralgias.  Skin:  Negative for color change and rash.  Neurological:  Negative for seizures and syncope.  All other systems reviewed and are negative.   Physical Exam Triage Vital Signs ED Triage Vitals [03/27/22 1841]  Enc Vitals Group     BP (!) 145/88     Pulse Rate 94     Resp 16     Temp (!) 97.1 F (36.2 C)     Temp Source Oral     SpO2 100 %     Weight      Height      Head Circumference      Peak Flow       Pain Score 6     Pain Loc      Pain Edu?      Excl. in [place]?    No data found.  Updated Vital Signs BP (!) 145/88 (BP Location: Left Arm)   Pulse 94   Temp (!) 97.1 F (36.2 C) (Oral)   Resp 16   SpO2 100%   Visual Acuity Right Eye Distance:   Left Eye Distance:   Bilateral Distance:    Right Eye Near:   Left Eye Near:    Bilateral Near:     Physical Exam Vitals and nursing note reviewed.  Constitutional:      General: She is not in acute distress.    Appearance: She is well-developed.  HENT:     Head: Normocephalic and atraumatic.  Eyes:     Conjunctiva/sclera: Conjunctivae normal.  Cardiovascular:     Rate and Rhythm: Normal rate and regular rhythm.     Heart sounds: No murmur heard. Pulmonary:     Effort: Pulmonary effort is normal. No respiratory distress.     Breath sounds: Normal breath sounds.  Abdominal:     Palpations: Abdomen is soft.     Tenderness: There is no abdominal tenderness.  Musculoskeletal:        General: No swelling.     Cervical back: Neck supple.  Skin:    General: Skin is warm and dry.     Capillary Refill: Capillary refill takes less than 2 seconds.  Neurological:     Mental Status: She is alert.  Psychiatric:        Mood and Affect: Mood normal.     UC Treatments / Results  Labs (all labs ordered are listed, but only abnormal results are displayed) Labs Reviewed - No data to display  EKG   Radiology No results found.  Procedures Procedures (including critical care time)  Medications Ordered in UC Medications  ketorolac (TORADOL) 30 MG/ML injection 30 mg (has no administration in time range)    Initial Impression / Assessment and Plan / UC Course  I have reviewed the triage vital signs and the nursing notes.  Pertinent labs & imaging results that  were available during my care of the patient were reviewed by me and considered in my medical decision making (see chart for details).     Lower back pain, no  injury or trauma, no radicular sx.  Muscle relaxer prescribed. Toradol given in clinic today.  Supportive care discussed.  Final Clinical Impressions(s) / UC Diagnoses   Final diagnoses:  Acute bilateral low back pain without sciatica     Discharge Instructions      Take tizanidine as needed for muscle spasm Recommend stretching, ice to affected area, and walking as tolerated    ED Prescriptions     Medication Sig Dispense Auth. Provider   tiZANidine (ZANAFLEX) 4 MG tablet Take 1 tablet (4 mg total) by mouth every 6 (six) hours as needed (muscle pain/ spasms). 30 tablet Ward, Lenise Arena, PA-C      PDMP not reviewed this encounter.   Ward, Lenise Arena, PA-C 03/27/22 1904

## 2024-04-21 ENCOUNTER — Ambulatory Visit (HOSPITAL_COMMUNITY)
Admission: EM | Admit: 2024-04-21 | Discharge: 2024-04-21 | Disposition: A | Discharge status: Home / Self Care | Payer: MEDICAID

## 2024-04-21 ENCOUNTER — Encounter (HOSPITAL_COMMUNITY): Payer: Self-pay

## 2024-04-21 DIAGNOSIS — R1032 Left lower quadrant pain: Secondary | ICD-10-CM | POA: Diagnosis not present

## 2024-04-21 NOTE — ED Triage Notes (Signed)
 Patient here today with c/o LLQ abd pain X 12 days. Patient states that the pain started after tripping and falling into the bedpost.

## 2024-04-21 NOTE — Discharge Instructions (Addendum)
 Tylenol  for pain, hot pad or ice to the abdominal wall You can call your primary care provider tomorrow morning to make a follow up appointment  Please go to the emergency department if symptoms worsen especially with severe pain

## 2024-04-21 NOTE — ED Provider Notes (Signed)
 [facility]    CSN: [id number] Arrival date & time: 04/21/24  1850      History   Chief Complaint Chief Complaint  Patient presents with   Abdominal Pain    HPI [name] Kirby is a 53 y.o. female.  12 days ago she walked into bed post. Had pain in the left lower quadrant. It comes and goes. Was worse with movement.  Does not bother her at rest. Having normal bowel movements No urinary symptoms No fever. Denies nausea/vomiting  Past Medical History:  Diagnosis Date   Abnormal Pap smear    Anxiety    Asthma    Depression    bipolar   Gallstones    Gonorrhea    Headache(784.0)    Trichomonas     There are no active problems to display for this patient.   Past Surgical History:  Procedure Laterality Date   CESAREAN SECTION  01/12/2012   Procedure: CESAREAN SECTION;  Surgeon: Lynwood KANDICE Solomons, MD;  Location: [facility];  Service: Gynecology;  Laterality: N/A;   EYE SURGERY Left 10/31/1975   EYE SURGERY     GALLBLADDER SURGERY     GYNECOLOGIC CRYOSURGERY      OB History     Gravida  3   Para  3   Term  3   Preterm  0   AB  0   Living  3      SAB  0   IAB  0   Ectopic  0   Multiple  0   Live Births  3            Home Medications    Prior to Admission medications   Medication Sig Start Date End Date Taking? Authorizing Provider  Budesonide 90 MCG/ACT inhaler Inhale 1 puff into the lungs 2 (two) times daily. 08/08/23  Yes [provider]  albuterol (PROVENTIL HFA;VENTOLIN HFA) 108 (90 Base) MCG/ACT inhaler Inhale 1-2 puffs into the lungs every 6 (six) hours as needed for wheezing or shortness of breath.    [provider]  cetirizine (ZYRTEC) 10 MG tablet Take 10 mg by mouth daily.    [provider]  fluticasone  (FLONASE ) 50 MCG/ACT nasal spray Place 2 sprays into both nostrils daily.    [provider]  naproxen  (NAPROSYN ) 500 MG tablet Take 1 tablet (500 mg total) by mouth 2 (two)  times daily with a meal. 12/14/21   Crain, Whitney L, PA  ondansetron  (ZOFRAN  ODT) 4 MG disintegrating tablet Take 1 tablet (4 mg total) by mouth every 8 (eight) hours as needed for nausea or vomiting. 08/27/17   Ward, Jaime Pilcher, PA-C  sertraline (ZOLOFT) 50 MG tablet Take 50 mg by mouth daily. Reported on 04/03/2016    [provider]  tiZANidine  (ZANAFLEX ) 4 MG tablet Take 1 tablet (4 mg total) by mouth every 6 (six) hours as needed (muscle pain/ spasms). 03/27/22   Ward, Jessica Z, PA-C  traZODone (DESYREL) 50 MG tablet Take 50 mg by mouth at bedtime as needed for sleep.     [provider]    Family History Family History  Problem Relation Age of Onset   Diabetes Mother    Hypertension Mother    Diabetes Maternal Grandmother    Kidney disease Maternal Grandmother    Anesthesia problems Neg Hx    Hypotension Neg Hx    Pseudochol deficiency Neg Hx    Malignant hyperthermia Neg Hx  Social History Social History   Tobacco Use   Smoking status: Former    Current packs/day: 0.25    Types: Cigarettes   Smokeless tobacco: Never   Tobacco comments:    quit in around 1993  Substance Use Topics   Alcohol use: No   Drug use: No     Allergies   Ketotifen  fumarate, Other, and Triamcinolone    Review of Systems Review of Systems As per HPI  Physical Exam Triage Vital Signs ED Triage Vitals  Encounter Vitals Group     BP 04/21/24 1953 138/83     Girls Systolic BP Percentile --      Girls Diastolic BP Percentile --      Boys Systolic BP Percentile --      Boys Diastolic BP Percentile --      Pulse Rate 04/21/24 1953 78     Resp 04/21/24 1953 16     Temp 04/21/24 1953 98.3 F (36.8 C)     Temp Source 04/21/24 1953 Oral     SpO2 04/21/24 1953 98 %     Weight --      Height --      Head Circumference --      Peak Flow --      Pain Score 04/21/24 1952 6     Pain Loc --      Pain Education --      Exclude from Growth Chart --    No data  found.  Updated Vital Signs BP 138/83 (BP Location: Left Arm)   Pulse 78   Temp 98.3 F (36.8 C) (Oral)   Resp 16   LMP  (LMP Unknown)   SpO2 98%    Physical Exam Vitals and nursing note reviewed.  Constitutional:      Appearance: Normal appearance.  HENT:     Mouth/Throat:     Mouth: Mucous membranes are moist.     Pharynx: Oropharynx is clear.   Eyes:     Conjunctiva/sclera: Conjunctivae normal.    Cardiovascular:     Rate and Rhythm: Normal rate and regular rhythm.     Heart sounds: Normal heart sounds.  Pulmonary:     Effort: Pulmonary effort is normal. No respiratory distress.     Breath sounds: Normal breath sounds.  Abdominal:     General: Bowel sounds are normal.     Palpations: Abdomen is soft.     Tenderness: There is no abdominal tenderness. There is no guarding or rebound.   Musculoskeletal:        General: Normal range of motion.   Skin:    General: Skin is warm and dry.     Comments: No bruising or skin changes of abdomen    Neurological:     Mental Status: She is alert and oriented to person, place, and time.     UC Treatments / Results  Labs (all labs ordered are listed, but only abnormal results are displayed) Labs Reviewed - No data to display  EKG   Radiology No results found.  Procedures Procedures (including critical care time)  Medications Ordered in UC Medications - No data to display  Initial Impression / Assessment and Plan / UC Course  I have reviewed the triage vital signs and the nursing notes.  Pertinent labs & imaging results that were available during my care of the patient were reviewed by me and considered in my medical decision making (see chart for details).  No tenderness of abdomen, benign exam  Stable vitals No red flags Discussed possible etiologies, supportive care, follow up with primary care ED Precautions  Final Clinical Impressions(s) / UC Diagnoses   Final diagnoses:  LLQ pain  Abdominal wall  pain in left lower quadrant     Discharge Instructions      Tylenol  for pain, hot pad or ice to the abdominal wall You can call your primary care provider tomorrow morning to make a follow up appointment  Please go to the emergency department if symptoms worsen especially with severe pain    ED Prescriptions   None    PDMP not reviewed this encounter.   Ramona Slinger, PA-C 04/21/24 2024
